# Patient Record
Sex: Female | Born: 1945 | Race: Black or African American | Hispanic: No | Smoking: Never smoker
Health system: Southern US, Community
[De-identification: ages and names within clinical notes are randomized; demographics above are authoritative.]

## PROBLEM LIST (undated history)

## (undated) DIAGNOSIS — Z972 Presence of dental prosthetic device (complete) (partial): Secondary | ICD-10-CM

## (undated) DIAGNOSIS — E119 Type 2 diabetes mellitus without complications: Secondary | ICD-10-CM

## (undated) DIAGNOSIS — E785 Hyperlipidemia, unspecified: Secondary | ICD-10-CM

## (undated) DIAGNOSIS — M1990 Unspecified osteoarthritis, unspecified site: Secondary | ICD-10-CM

## (undated) DIAGNOSIS — T753XXA Motion sickness, initial encounter: Secondary | ICD-10-CM

## (undated) DIAGNOSIS — I10 Essential (primary) hypertension: Secondary | ICD-10-CM

## (undated) HISTORY — PX: ABDOMINAL HYSTERECTOMY: SHX81

## (undated) HISTORY — PX: COLONOSCOPY: SHX174

---

## 2005-02-02 ENCOUNTER — Ambulatory Visit: Payer: Self-pay | Admitting: Internal Medicine

## 2006-06-07 ENCOUNTER — Ambulatory Visit: Payer: Self-pay | Admitting: Internal Medicine

## 2007-06-08 ENCOUNTER — Ambulatory Visit: Payer: Self-pay | Admitting: Internal Medicine

## 2007-06-15 ENCOUNTER — Emergency Department: Payer: Self-pay

## 2007-10-19 ENCOUNTER — Ambulatory Visit: Payer: Self-pay | Admitting: Gastroenterology

## 2007-11-29 ENCOUNTER — Ambulatory Visit: Payer: Self-pay | Admitting: Internal Medicine

## 2007-12-25 ENCOUNTER — Ambulatory Visit: Payer: Self-pay | Admitting: Surgery

## 2007-12-25 ENCOUNTER — Other Ambulatory Visit: Payer: Self-pay

## 2008-09-22 IMAGING — US ULTRASOUND RIGHT BREAST
1 series · 17 of 20 positions shown · non-contrast
Comparison: RIGHT mammogram and RIGHT breast ultrasound on 11/29/07.

REASON FOR EXAM: right breast mass   surg 10am    [id number]
COMMENTS:

PROCEDURE:     US  - US BREAST RIGHT  - December 27, 2007  [DATE]
RESULT:

[Series 1: ultrasound right breast · 17 of 20 slices shown]
[im 1/20]
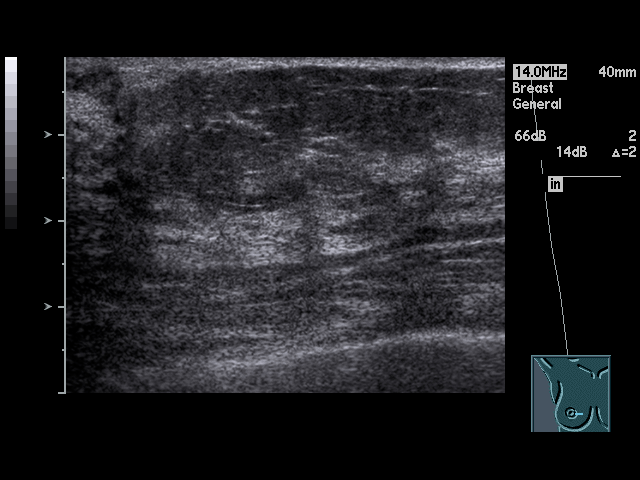
[im 2/20]
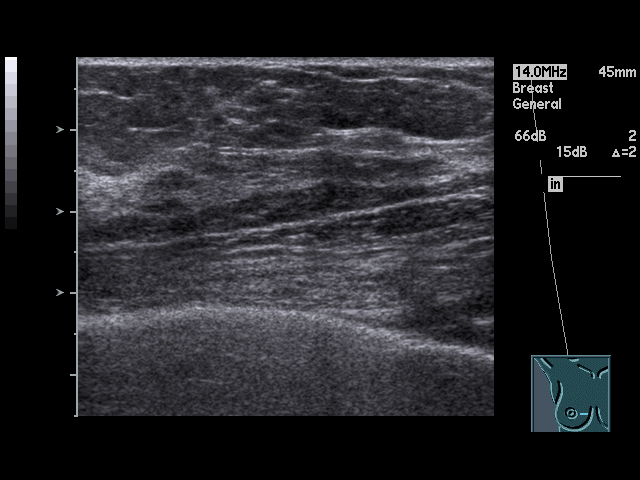
[im 3/20]
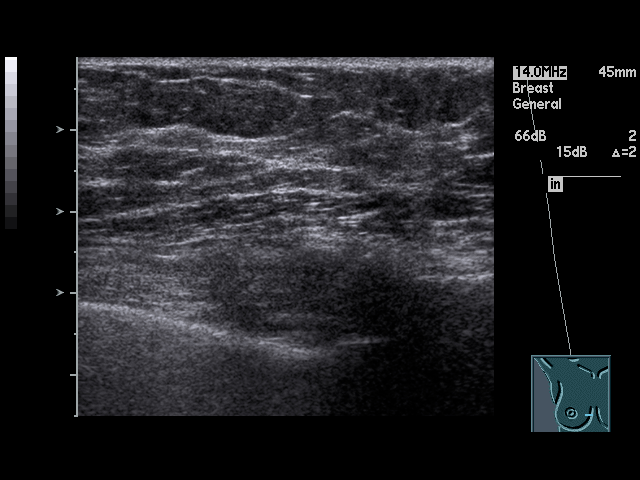
[im 5/20]
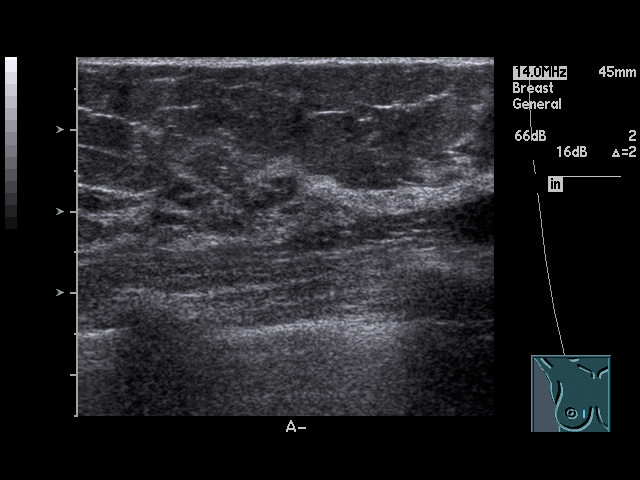
[im 6/20]
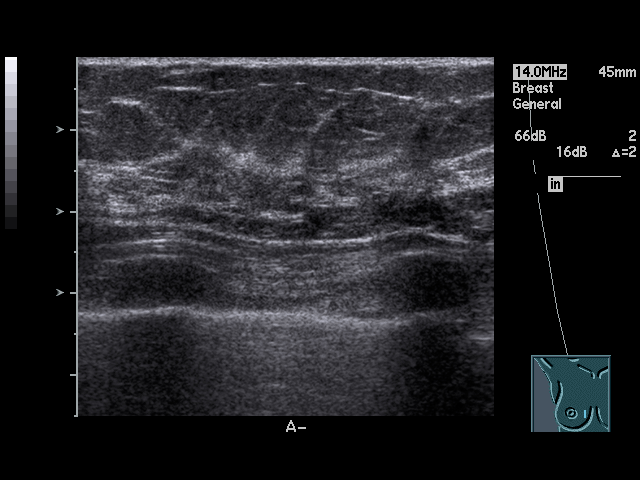
[im 7/20]
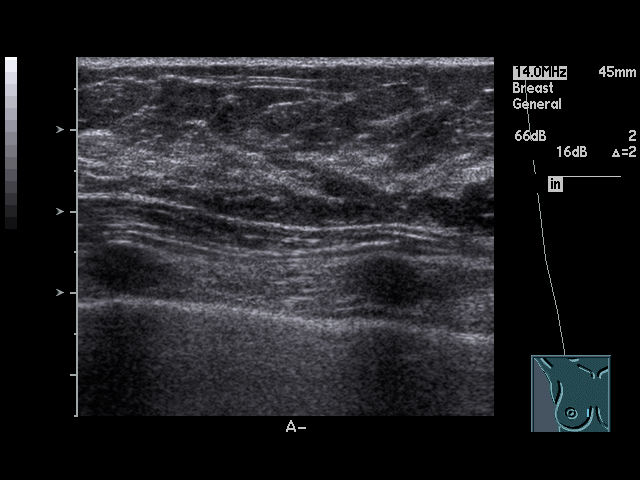
[im 8/20]
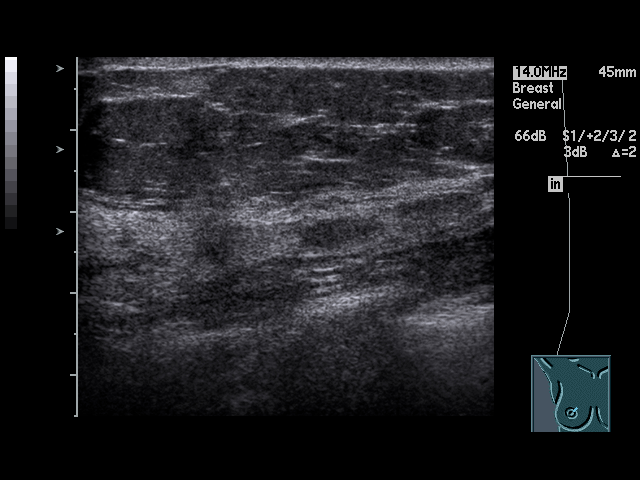
[im 9/20]
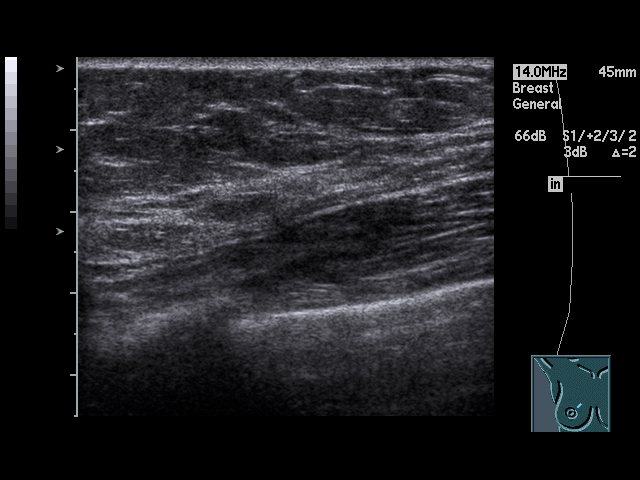
[im 11/20]
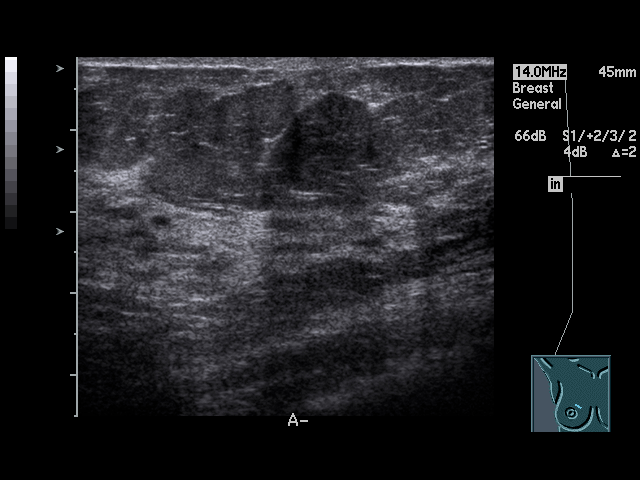
[im 12/20]
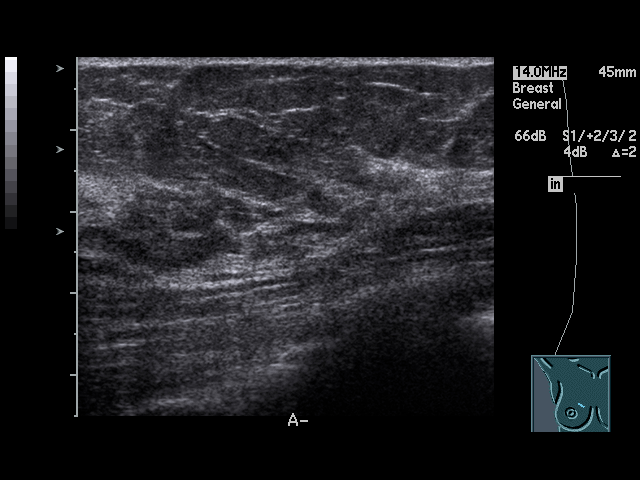
[im 13/20]
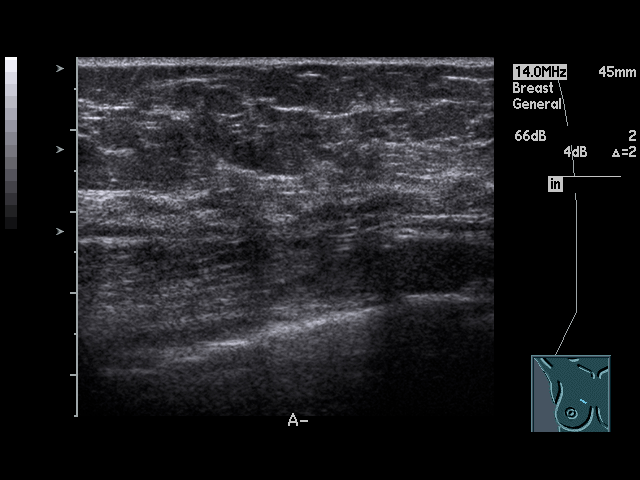
[im 14/20]
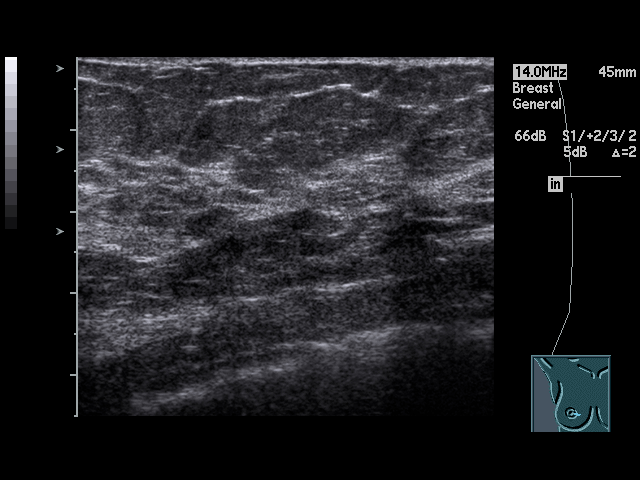
[im 15/20]
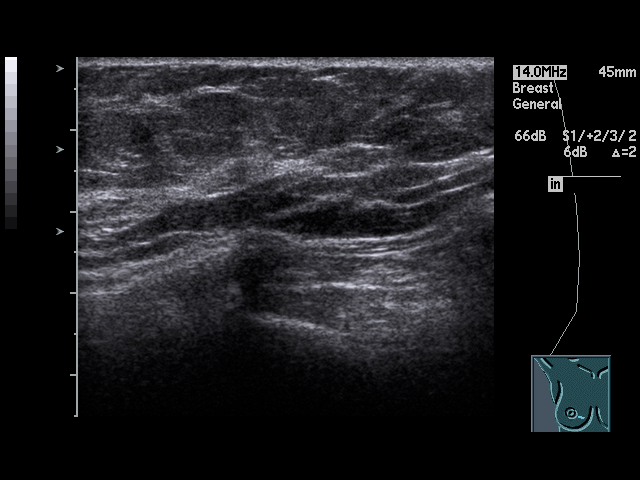
[im 16/20]
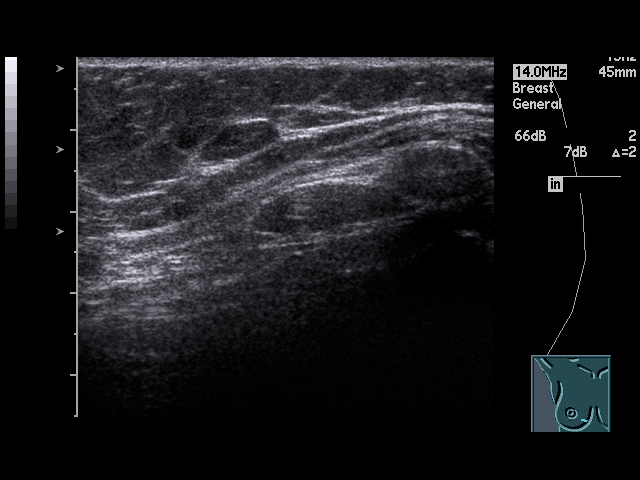
[im 18/20]
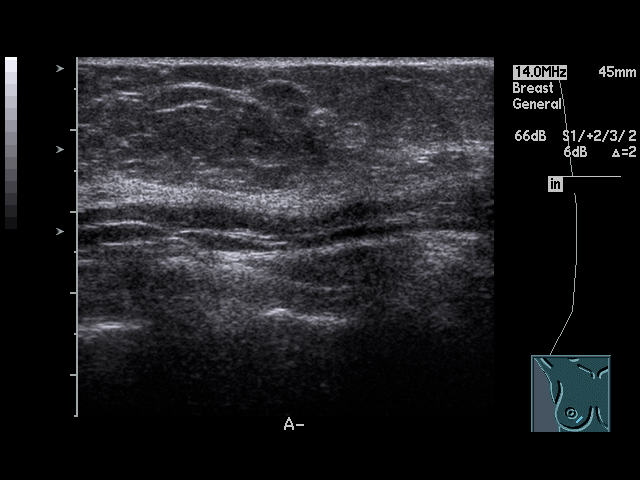
[im 19/20]
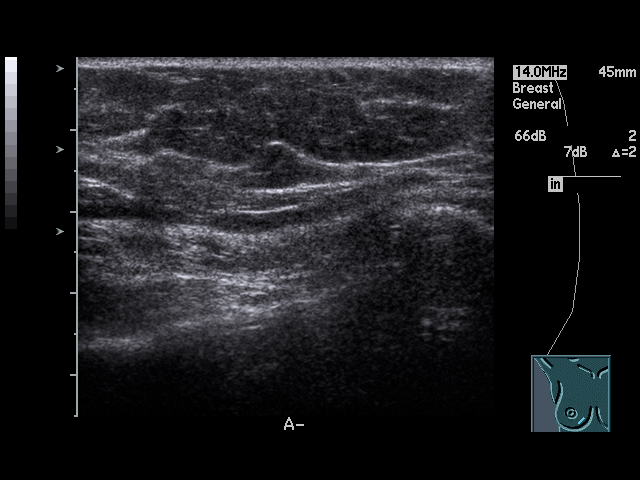
[im 20/20]
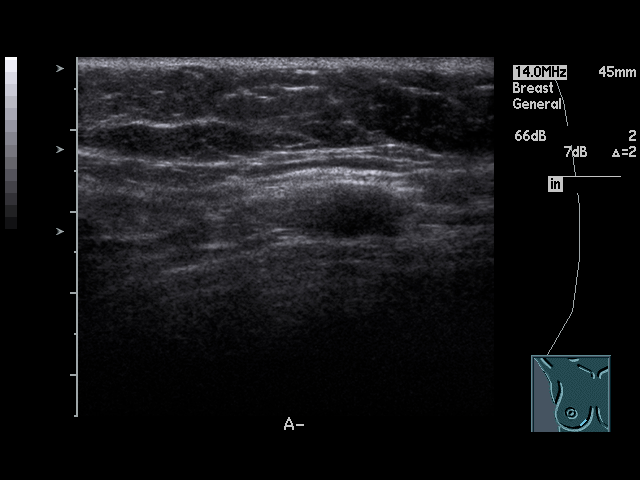

[17 of 20 positions shown; findings below may reference images not displayed]

FINDINGS: Focused RIGHT breast ultrasound was performed in anticipation
for ultrasound-guided wire localization.  The RIGHT breast was imaged from 2
to 4 o'clock.  Previously noted 4 mm hypoechoic nodule at the RIGHT breast 3
o'clock region is not seen on the current exam.  No cystic or solid mass is
seen involving the RIGHT breast from 2 to 4 o'clock.

Per patient, she no longer feels her previously noted RIGHT breast nodule.
IMPRESSION: 1.     Previously noted 4 mm nodule at the 3 o'clock region of the RIGHT
breast is not seen on the current exam.  This was discussed with the patient
and Dr. Rommel. Wire localization is cancelled at this time.
2.     Recommend clinical follow up as well as follow up by patient
self-breast exams.

## 2010-04-15 ENCOUNTER — Ambulatory Visit: Payer: Self-pay | Admitting: Obstetrics and Gynecology

## 2011-06-23 ENCOUNTER — Ambulatory Visit: Payer: Self-pay | Admitting: Internal Medicine

## 2012-08-04 ENCOUNTER — Ambulatory Visit: Payer: Self-pay | Admitting: Internal Medicine

## 2013-08-07 ENCOUNTER — Ambulatory Visit: Payer: Self-pay | Admitting: Internal Medicine

## 2014-07-19 ENCOUNTER — Ambulatory Visit: Payer: Self-pay | Admitting: Gastroenterology

## 2014-08-08 ENCOUNTER — Ambulatory Visit: Payer: Self-pay | Admitting: Gastroenterology

## 2015-01-20 LAB — SURGICAL PATHOLOGY

## 2015-03-24 DIAGNOSIS — I10 Essential (primary) hypertension: Secondary | ICD-10-CM | POA: Insufficient documentation

## 2015-03-24 DIAGNOSIS — E7800 Pure hypercholesterolemia, unspecified: Secondary | ICD-10-CM | POA: Insufficient documentation

## 2015-03-24 DIAGNOSIS — E119 Type 2 diabetes mellitus without complications: Secondary | ICD-10-CM | POA: Insufficient documentation

## 2015-03-24 DIAGNOSIS — I152 Hypertension secondary to endocrine disorders: Secondary | ICD-10-CM | POA: Insufficient documentation

## 2015-05-06 ENCOUNTER — Other Ambulatory Visit: Payer: Self-pay | Admitting: Nurse Practitioner

## 2015-05-06 DIAGNOSIS — Z13820 Encounter for screening for osteoporosis: Secondary | ICD-10-CM

## 2015-05-15 ENCOUNTER — Ambulatory Visit: Payer: Self-pay | Attending: Nurse Practitioner

## 2016-06-30 DIAGNOSIS — E1165 Type 2 diabetes mellitus with hyperglycemia: Secondary | ICD-10-CM | POA: Insufficient documentation

## 2016-06-30 DIAGNOSIS — Z794 Long term (current) use of insulin: Secondary | ICD-10-CM

## 2016-11-01 ENCOUNTER — Ambulatory Visit: Payer: Federal, State, Local not specified - PPO

## 2016-11-01 ENCOUNTER — Encounter (INDEPENDENT_AMBULATORY_CARE_PROVIDER_SITE_OTHER): Payer: Self-pay | Admitting: Podiatry

## 2016-11-01 DIAGNOSIS — M2011 Hallux valgus (acquired), right foot: Secondary | ICD-10-CM

## 2016-11-01 NOTE — Progress Notes (Signed)
This encounter was created in error - please disregard.

## 2016-11-09 ENCOUNTER — Encounter: Payer: Self-pay | Admitting: Podiatry

## 2016-11-15 ENCOUNTER — Ambulatory Visit: Payer: Self-pay | Admitting: Podiatry

## 2016-11-17 ENCOUNTER — Other Ambulatory Visit: Payer: Self-pay | Admitting: *Deleted

## 2016-11-17 ENCOUNTER — Ambulatory Visit (INDEPENDENT_AMBULATORY_CARE_PROVIDER_SITE_OTHER): Payer: Federal, State, Local not specified - PPO | Admitting: Podiatry

## 2016-11-17 ENCOUNTER — Encounter: Payer: Self-pay | Admitting: Podiatry

## 2016-11-17 ENCOUNTER — Ambulatory Visit (INDEPENDENT_AMBULATORY_CARE_PROVIDER_SITE_OTHER): Payer: Federal, State, Local not specified - PPO

## 2016-11-17 ENCOUNTER — Ambulatory Visit: Payer: Federal, State, Local not specified - PPO

## 2016-11-17 ENCOUNTER — Encounter: Payer: Self-pay | Admitting: *Deleted

## 2016-11-17 DIAGNOSIS — M216X1 Other acquired deformities of right foot: Secondary | ICD-10-CM | POA: Diagnosis not present

## 2016-11-17 DIAGNOSIS — M2041 Other hammer toe(s) (acquired), right foot: Secondary | ICD-10-CM

## 2016-11-17 DIAGNOSIS — M2011 Hallux valgus (acquired), right foot: Secondary | ICD-10-CM | POA: Diagnosis not present

## 2016-11-17 NOTE — Patient Instructions (Signed)

## 2016-11-17 NOTE — Progress Notes (Signed)
   Subjective:    Patient ID: Lisa Fletcher, female    DOB: 10/04/1945, 71 y.o.   MRN: [id number]  HPI: She presents today with a chief complaint of pain to the first metatarsophalangeal joint of the right foot. She states it is been bothering her for several years and now that she is no longer working she would like to have this fixed. She states that it becomes painful and is inhibiting her ability to perform her daily activities and stay active which is necessary to help control her diabetes. She states that her diabetes has progressively worsened and her current hemoglobin A1c is unknown. She sees Dr. Einar CrowMarshall Anderson as her general practitioner.    Review of Systems  Musculoskeletal: Positive for arthralgias.  All other systems reviewed and are negative.      Objective:   Physical Exam: I'll signs are stable she is alert and oriented 3. Pulses are strongly palpable. Neurologic sensorium is intact. Deep tendon reflexes are intact muscle strength is normal bilateral. Orthopedic evaluation of his joints all joints distal to the ankle for range of motion by crepitation. Cutaneous evaluationsupple hydrated cutis no open lesions or wounds. She has pain on palpation first metatarsophalangeal joint and second metatarsophalangeal joint with hammertoe deformities of second digit of the right foot. She has contracture deformity at the PIPJ and plantarflex her nature and it appears to be rigid and will not straighten to 180. Radiographs taken today 3 views of the bilateral foot demonstrating right foot increase in the first intermetatarsal angle and an elongated second metatarsal. Hammertoe deformity second digit is also demonstrated. Otherwise no osseous abnormalities such as fractures are identified. Cutaneous evaluationof wall hydrated cutis no erythema cellulitis drainage or odor with exception of the overlying erythema of the first metatarsophalangeal joint.        Assessment & Plan:  Diabetes  mellitus with questionable control. 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  Plan: We discussed the etiology pathology conservative versus surgical therapies. We discussed performing an Austin bunion repair second metatarsal osteotomy and hammertoe repair second digit. I answered all the questions regarding these procedures to the best of my ability in layman's terms. We did discuss the possible postop complications which may include but are not limited limited to postop pain bleeding swelling infection recurrence need for further surgery over correction under correction loss of digit loss of limb loss of life failure of the wound to heal because of the diabetes and possible amputation. She understands this is amenable to it and would like to proceed. I did discuss with her that we would have to get medical clearance from Dr. Dareen PianoAnderson she understands this and is amenable to that. We dispensed all the literature today concerning the surgical center and the [facility] that will be necessary postoperatively. I will follow-up with her at time of surgery once cleared.

## 2017-03-18 ENCOUNTER — Other Ambulatory Visit: Payer: Self-pay | Admitting: Internal Medicine

## 2017-03-18 DIAGNOSIS — Z1231 Encounter for screening mammogram for malignant neoplasm of breast: Secondary | ICD-10-CM

## 2017-04-06 ENCOUNTER — Ambulatory Visit
Admission: RE | Admit: 2017-04-06 | Discharge: 2017-04-06 | Disposition: A | Discharge status: Home / Self Care | Payer: Federal, State, Local not specified - PPO | Source: Ambulatory Visit | Attending: Internal Medicine | Admitting: Internal Medicine

## 2017-04-06 DIAGNOSIS — Z1231 Encounter for screening mammogram for malignant neoplasm of breast: Secondary | ICD-10-CM | POA: Diagnosis present

## 2017-04-18 ENCOUNTER — Encounter: Payer: Self-pay | Admitting: *Deleted

## 2017-04-18 DIAGNOSIS — G5603 Carpal tunnel syndrome, bilateral upper limbs: Secondary | ICD-10-CM | POA: Diagnosis present

## 2017-04-18 DIAGNOSIS — E785 Hyperlipidemia, unspecified: Secondary | ICD-10-CM | POA: Diagnosis not present

## 2017-04-18 DIAGNOSIS — Z794 Long term (current) use of insulin: Secondary | ICD-10-CM | POA: Diagnosis not present

## 2017-04-18 DIAGNOSIS — E1165 Type 2 diabetes mellitus with hyperglycemia: Secondary | ICD-10-CM | POA: Diagnosis not present

## 2017-04-18 DIAGNOSIS — Z79899 Other long term (current) drug therapy: Secondary | ICD-10-CM | POA: Diagnosis not present

## 2017-04-18 DIAGNOSIS — I10 Essential (primary) hypertension: Secondary | ICD-10-CM | POA: Diagnosis not present

## 2017-04-20 ENCOUNTER — Encounter
Admission: RE | Disposition: A | Discharge status: Home / Self Care | Payer: Self-pay | Source: Ambulatory Visit | Attending: Surgery

## 2017-04-20 ENCOUNTER — Ambulatory Visit
Admission: RE | Admit: 2017-04-20 | Discharge: 2017-04-20 | Disposition: A | Discharge status: Home / Self Care | Payer: Federal, State, Local not specified - PPO | Source: Ambulatory Visit | Attending: Surgery | Admitting: Surgery

## 2017-04-20 ENCOUNTER — Ambulatory Visit: Payer: Federal, State, Local not specified - PPO | Admitting: Anesthesiology

## 2017-04-20 DIAGNOSIS — E1165 Type 2 diabetes mellitus with hyperglycemia: Secondary | ICD-10-CM | POA: Insufficient documentation

## 2017-04-20 DIAGNOSIS — G5603 Carpal tunnel syndrome, bilateral upper limbs: Secondary | ICD-10-CM | POA: Diagnosis not present

## 2017-04-20 DIAGNOSIS — E785 Hyperlipidemia, unspecified: Secondary | ICD-10-CM | POA: Insufficient documentation

## 2017-04-20 DIAGNOSIS — Z79899 Other long term (current) drug therapy: Secondary | ICD-10-CM | POA: Insufficient documentation

## 2017-04-20 DIAGNOSIS — Z794 Long term (current) use of insulin: Secondary | ICD-10-CM | POA: Insufficient documentation

## 2017-04-20 DIAGNOSIS — I10 Essential (primary) hypertension: Secondary | ICD-10-CM | POA: Insufficient documentation

## 2017-04-20 HISTORY — DX: Type 2 diabetes mellitus without complications: E11.9

## 2017-04-20 HISTORY — DX: Presence of dental prosthetic device (complete) (partial): Z97.2

## 2017-04-20 HISTORY — DX: Motion sickness, initial encounter: T75.3XXA

## 2017-04-20 HISTORY — DX: Hyperlipidemia, unspecified: E78.5

## 2017-04-20 HISTORY — PX: CARPAL TUNNEL RELEASE: SHX101

## 2017-04-20 HISTORY — DX: Essential (primary) hypertension: I10

## 2017-04-20 LAB — GLUCOSE, CAPILLARY
Glucose-Capillary: 309 mg/dL — ABNORMAL HIGH (ref 65–99)
Glucose-Capillary: 284 mg/dL — ABNORMAL HIGH (ref 65–99)
Glucose-Capillary: 248 mg/dL — ABNORMAL HIGH (ref 65–99)

## 2017-04-20 SURGERY — RELEASE, CARPAL TUNNEL, ENDOSCOPIC
Anesthesia: Regional | Laterality: Left | Wound class: Clean

## 2017-04-20 MED ORDER — CLINDAMYCIN PHOSPHATE 600 MG/50ML IV SOLN
INTRAVENOUS | Status: DC | PRN
  Administered 2017-04-20: 600 mg via INTRAVENOUS

## 2017-04-20 MED ORDER — HYDROMORPHONE HCL 1 MG/ML IJ SOLN: 0.2500 mg | INTRAMUSCULAR | Status: DC | PRN

## 2017-04-20 MED ORDER — ONDANSETRON HCL 4 MG/2ML IJ SOLN
4.0000 mg | Freq: Once | INTRAMUSCULAR | Status: AC
  Administered 2017-04-20: 4 mg via INTRAVENOUS

## 2017-04-20 MED ORDER — HYDROCODONE-ACETAMINOPHEN 5-325 MG PO TABS
1.0000 | ORAL_TABLET | Freq: Four times a day (QID) | ORAL | 0 refills | Status: DC | PRN
Start: 2017-04-20 — End: 2017-06-08

## 2017-04-20 MED ORDER — INSULIN REGULAR HUMAN 100 UNIT/ML IJ SOLN
10.0000 [IU] | Freq: Once | INTRAMUSCULAR | Status: AC
  Administered 2017-04-20: 10 [IU] via SUBCUTANEOUS

## 2017-04-20 MED ORDER — ACETAMINOPHEN 325 MG PO TABS: 325.0000 mg | ORAL_TABLET | ORAL | Status: DC | PRN

## 2017-04-20 MED ORDER — CEFAZOLIN SODIUM-DEXTROSE 2-4 GM/100ML-% IV SOLN
2.0000 g | Freq: Once | INTRAVENOUS | Status: AC
  Administered 2017-04-20: 2 g via INTRAVENOUS

## 2017-04-20 MED ORDER — ACETAMINOPHEN 160 MG/5ML PO SOLN: 325.0000 mg | ORAL | Status: DC | PRN

## 2017-04-20 MED ORDER — ONDANSETRON HCL 4 MG/2ML IJ SOLN: 4.0000 mg | Freq: Once | INTRAMUSCULAR | Status: DC | PRN

## 2017-04-20 MED ORDER — PROPOFOL 500 MG/50ML IV EMUL
INTRAVENOUS | Status: DC | PRN
  Administered 2017-04-20: 120 ug/kg/min via INTRAVENOUS

## 2017-04-20 MED ORDER — ROPIVACAINE HCL 5 MG/ML IJ SOLN
INTRAMUSCULAR | Status: DC | PRN
  Administered 2017-04-20: 10 mL

## 2017-04-20 MED ORDER — MIDAZOLAM HCL 5 MG/5ML IJ SOLN
INTRAMUSCULAR | Status: DC | PRN
  Administered 2017-04-20: 2 mg via INTRAVENOUS

## 2017-04-20 MED ORDER — LACTATED RINGERS IV SOLN
INTRAVENOUS | Status: DC
  Administered 2017-04-20: 11:00:00 via INTRAVENOUS

## 2017-04-20 MED ORDER — DIPHENHYDRAMINE HCL 50 MG/ML IJ SOLN
12.5000 mg | Freq: Once | INTRAMUSCULAR | Status: AC
  Administered 2017-04-20: 12.5 mg via INTRAVENOUS

## 2017-04-20 MED ORDER — LIDOCAINE HCL (PF) 0.5 % IJ SOLN
INTRAMUSCULAR | Status: DC | PRN
  Administered 2017-04-20: 35 mL via INTRAVENOUS

## 2017-04-20 MED ORDER — OXYCODONE HCL 5 MG PO TABS: 5.0000 mg | ORAL_TABLET | Freq: Once | ORAL | Status: DC | PRN

## 2017-04-20 MED ORDER — PROPOFOL 10 MG/ML IV BOLUS
INTRAVENOUS | Status: DC | PRN
  Administered 2017-04-20: 20 mg via INTRAVENOUS
  Administered 2017-04-20: 30 mg via INTRAVENOUS

## 2017-04-20 MED ORDER — OXYCODONE HCL 5 MG/5ML PO SOLN: 5.0000 mg | Freq: Once | ORAL | Status: DC | PRN

## 2017-04-20 SURGICAL SUPPLY — 27 items
BANDAGE ELASTIC 2 LF NS (GAUZE/BANDAGES/DRESSINGS) ×3 IMPLANT
BNDG COHESIVE 4X5 TAN STRL (GAUZE/BANDAGES/DRESSINGS) ×3 IMPLANT
BNDG ESMARK 4X12 TAN STRL LF (GAUZE/BANDAGES/DRESSINGS) ×3 IMPLANT
CHLORAPREP W/TINT 26ML (MISCELLANEOUS) ×3 IMPLANT
CORD BIP STRL DISP 12FT (MISCELLANEOUS) ×3 IMPLANT
COVER LIGHT HANDLE UNIVERSAL (MISCELLANEOUS) ×6 IMPLANT
CUFF TOURNIQUET DUAL PORT 18X3 (MISCELLANEOUS) ×3 IMPLANT
DRAPE SURG 17X11 SM STRL (DRAPES) ×3 IMPLANT
GAUZE PETRO XEROFOAM 1X8 (MISCELLANEOUS) ×3 IMPLANT
GAUZE SPONGE 4X4 12PLY STRL (GAUZE/BANDAGES/DRESSINGS) ×3 IMPLANT
GLOVE BIO SURGEON STRL SZ8 (GLOVE) ×3 IMPLANT
GLOVE INDICATOR 8.0 STRL GRN (GLOVE) ×3 IMPLANT
GOWN STRL REUS W/ TWL LRG LVL3 (GOWN DISPOSABLE) ×1 IMPLANT
GOWN STRL REUS W/ TWL XL LVL3 (GOWN DISPOSABLE) ×1 IMPLANT
GOWN STRL REUS W/TWL LRG LVL3 (GOWN DISPOSABLE) ×2
GOWN STRL REUS W/TWL XL LVL3 (GOWN DISPOSABLE) ×2
KIT CARPAL TUNNEL (MISCELLANEOUS) ×2
KIT ESCP INSRT D SLOT CANN KN (MISCELLANEOUS) ×1 IMPLANT
KIT ROOM TURNOVER OR (KITS) ×3 IMPLANT
NS IRRIG 500ML POUR BTL (IV SOLUTION) ×3 IMPLANT
PACK EXTREMITY ARMC (MISCELLANEOUS) ×3 IMPLANT
SPLINT WRIST/FOREARM M LT TX99 (SOFTGOODS) ×3 IMPLANT
STOCKINETTE IMPERVIOUS 9X36 MD (GAUZE/BANDAGES/DRESSINGS) ×3 IMPLANT
STRAP BODY AND KNEE 60X3 (MISCELLANEOUS) ×3 IMPLANT
SUT PROLENE 4 0 PS 2 18 (SUTURE) ×3 IMPLANT
SUT VIC AB 3-0 SH 27 (SUTURE)
SUT VIC AB 3-0 SH 27X BRD (SUTURE) IMPLANT

## 2017-04-20 NOTE — Transfer of Care (Signed)
Immediate Anesthesia Transfer of Care Note  Patient: Lisa Fletcher  Procedure(s) Performed: Procedure(s) with comments: CARPAL TUNNEL RELEASE ENDOSCOPIC (Left) - Diabetic - insulin  Patient Location: PACU  Anesthesia Type: Bier Block  Level of Consciousness: awake, alert  and patient cooperative  Airway and Oxygen Therapy: Patient Spontanous Breathing and Patient connected to supplemental oxygen  Post-op Assessment: Post-op Vital signs reviewed, Patient's Cardiovascular Status Stable, Respiratory Function Stable, Patent Airway and No signs of Nausea or vomiting  Post-op Vital Signs: Reviewed and stable  Complications: No apparent anesthesia complications

## 2017-04-20 NOTE — Anesthesia Postprocedure Evaluation (Signed)
Anesthesia Post Note  Patient: Urbano HeirStella L Coryell  Procedure(s) Performed: Procedure(s) (LRB): CARPAL TUNNEL RELEASE ENDOSCOPIC (Left)  Patient location during evaluation: PACU Anesthesia Type: Bier Block Level of consciousness: awake and alert Pain management: pain level controlled Vital Signs Assessment: post-procedure vital signs reviewed and stable Respiratory status: spontaneous breathing, nonlabored ventilation, respiratory function stable and patient connected to nasal cannula oxygen Cardiovascular status: blood pressure returned to baseline and stable Postop Assessment: no signs of nausea or vomiting Anesthetic complications: no    Alta CorningBacon, Joselinne Lawal S

## 2017-04-20 NOTE — Op Note (Signed)
04/20/2017  12:51 PM  Patient:   Lisa Fletcher  Pre-Op Diagnosis:   Left carpal tunnel syndrome.  Post-Op Diagnosis:   Same.  Procedure:   Endoscopic left carpal tunnel release.  Surgeon:   Maryagnes AmosJ. Jeffrey Genita Nilsson, MD  Anesthesia:   Bier block  Findings:   As above.  Complications:   None  EBL:   0 cc  Fluids:   300 cc crystalloid  TT:   33 minutes at 250 mmHg  Drains:   None  Closure:   4-0 Prolene interrupted sutures  Brief Clinical Note:   The patient is a 71 year old female with a history of progressively worsening pain and paresthesias to her left hand. Her history and examination are consistent with carpal tunnel syndrome. The symptoms have persisted despite medications, activity modification, etc. The patient presents at this time for an endoscopic left carpal tunnel release.   Procedure:   The patient was brought into the operating room and lain in the supine position. After adequate IV sedation was achieved, a timeout was performed to verify the appropriate surgical site before a Bier block was placed by the anesthesiologist and the tourniquet inflated to 250 mmHg. The left hand and upper extremity were prepped with ChloraPrep solution before being draped sterilely. Preoperative antibiotics were administered. An approximately 1.5-2 cm incision was made over the volar wrist flexion crease, centered over the palmaris longus tendon. The incision was carried down through the subcutaneous tissues with care taken to identify and protect any neurovascular structures. The distal forearm fascia was penetrated just proximal to the transverse carpal ligament. The soft tissues were released off the superficial and deep surfaces of the distal forearm fascia and this was released proximally for 3-4 cm under direct visualization.  Attention was directed distally. The Therapist, nutritionalreer elevator was passed beneath the transverse carpal ligament along the ulnar aspect of the carpal tunnel and used to release  any adhesions as well as to remove any adherent synovial tissue before first the smaller then the larger of the two dilators were passed beneath the transverse carpal ligament along the ulnar margin of the carpal tunnel. The slotted cannula was introduced and the endoscope was placed into the slotted cannula and the undersurface of the transverse carpal ligament visualized. The distal margin of the transverse carpal ligament was marked by placing a 25-gauge needle percutaneously at Kaplan's cardinal point so that it entered the distal portion of the slotted cannula. Under endoscopic visualization, the transverse carpal ligament was released from proximal to distal using the end-cutting blade. A second pass was performed to ensure complete release of the ligament. The adequacy of release was verified both endoscopically and by palpation using the freer elevator.  The wound was irrigated thoroughly with sterile saline solution before being closed using 4-0 Prolene interrupted sutures. A total of 10 cc of 0.5% plain Sensorcaine was injected in and around the incision before a sterile bulky dressing was applied to the wound. The patient was placed into a volar wrist splint before being awakened and returned to the recovery room in satisfactory condition after tolerating the procedure well.

## 2017-04-20 NOTE — H&P (Signed)
Paper H&P to be scanned into permanent record. H&P reviewed and patient re-examined. No changes. 

## 2017-04-20 NOTE — Progress Notes (Signed)
Patient experienced a sudden onset of nausea and vomiting. Verbal order received to discontinue ancef and administer zofran and benadryl IV. Noted improvement in nausea and vomiting prior to transfer to OR.

## 2017-04-20 NOTE — Anesthesia Preprocedure Evaluation (Signed)
Anesthesia Evaluation  Patient identified by MRN, date of birth, ID band Patient awake    Reviewed: Allergy & Precautions, H&P , NPO status , Patient's Chart, lab work & pertinent test results, reviewed documented beta blocker date and time   Airway Mallampati: II  TM Distance: >3 FB Neck ROM: full    Dental  (+) Partial Upper   Pulmonary neg pulmonary ROS,    Pulmonary exam normal breath sounds clear to auscultation       Cardiovascular Exercise Tolerance: Good hypertension, On Medications Normal cardiovascular exam Rhythm:regular Rate:Normal     Neuro/Psych negative neurological ROS  negative psych ROS   GI/Hepatic negative GI ROS, Neg liver ROS,   Endo/Other  diabetes, Poorly Controlled, Type 2, Insulin Dependent  Renal/GU negative Renal ROS  negative genitourinary   Musculoskeletal   Abdominal   Peds  Hematology negative hematology ROS (+)   Anesthesia Other Findings   Reproductive/Obstetrics negative OB ROS                             Anesthesia Physical  Anesthesia Plan  ASA: II  Anesthesia Plan: Bier Block   Post-op Pain Management:    Induction:   PONV Risk Score and Plan:   Airway Management Planned:   Additional Equipment:   Intra-op Plan:   Post-operative Plan:   Informed Consent: I have reviewed the patients History and Physical, chart, labs and discussed the procedure including the risks, benefits and alternatives for the proposed anesthesia with the patient or authorized representative who has indicated his/her understanding and acceptance.   Dental Advisory Given  Plan Discussed with: CRNA  Anesthesia Plan Comments:         Anesthesia Quick Evaluation  

## 2017-04-20 NOTE — Discharge Instructions (Signed)
General Anesthesia, Adult, Care After °These instructions provide you with information about caring for yourself after your procedure. Your health care provider may also give you more specific instructions. Your treatment has been planned according to current medical practices, but problems sometimes occur. Call your health care provider if you have any problems or questions after your procedure. °What can I expect after the procedure? °After the procedure, it is common to have: °· Vomiting. °· A sore throat. °· Mental slowness. ° °It is common to feel: °· Nauseous. °· Cold or shivery. °· Sleepy. °· Tired. °· Sore or achy, even in parts of your body where you did not have surgery. ° °Follow these instructions at home: °For at least 24 hours after the procedure: °· Do not: °? Participate in activities where you could fall or become injured. °? Drive. °? Use heavy machinery. °? Drink alcohol. °? Take sleeping pills or medicines that cause drowsiness. °? Make important decisions or sign legal documents. °? Take care of children on your own. °· Rest. °Eating and drinking °· If you vomit, drink water, juice, or soup when you can drink without vomiting. °· Drink enough fluid to keep your urine clear or pale yellow. °· Make sure you have little or no nausea before eating solid foods. °· Follow the diet recommended by your health care provider. °General instructions °· Have a responsible adult stay with you until you are awake and alert. °· Return to your normal activities as told by your health care provider. Ask your health care provider what activities are safe for you. °· Take over-the-counter and prescription medicines only as told by your health care provider. °· If you smoke, do not smoke without supervision. °· Keep all follow-up visits as told by your health care provider. This is important. °Contact a health care provider if: °· You continue to have nausea or vomiting at home, and medicines are not helpful. °· You  cannot drink fluids or start eating again. °· You cannot urinate after 8-12 hours. °· You develop a skin rash. °· You have fever. °· You have increasing redness at the site of your procedure. °Get help right away if: °· You have difficulty breathing. °· You have chest pain. °· You have unexpected bleeding. °· You feel that you are having a life-threatening or urgent problem. °This information is not intended to replace advice given to you by your health care provider. Make sure you discuss any questions you have with your health care provider. °Document Released: 12/20/2000 Document Revised: 02/16/2016 Document Reviewed: 08/28/2015 °Elsevier Interactive Patient Education © 2018 [employer] ° ° °Keep dressing dry and intact. °Keep hand elevated above heart level. °May shower after dressing removed on postop day 4 (Sunday). °Cover sutures with Band-Aids after drying off. °Apply ice to affected area frequently. °Take ibuprofen 600 mg TID with meals for 7-10 days, then as necessary. °Take ES Tylenol or pain medication as prescribed when needed.  °Return for follow-up in 10-14 days or as scheduled. °

## 2017-04-20 NOTE — Anesthesia Procedure Notes (Signed)
Procedure Name: MAC Date/Time: 04/20/2017 12:07 PM Performed by: Janna Arch Pre-anesthesia Checklist: Patient identified, Emergency Drugs available, Suction available and Patient being monitored Patient Re-evaluated:Patient Re-evaluated prior to induction Oxygen Delivery Method: Simple face mask

## 2017-04-21 ENCOUNTER — Encounter: Payer: Self-pay | Admitting: Surgery

## 2017-06-08 ENCOUNTER — Encounter
Admission: RE | Disposition: A | Discharge status: Home / Self Care | Payer: Self-pay | Source: Ambulatory Visit | Attending: Surgery

## 2017-06-08 ENCOUNTER — Ambulatory Visit: Payer: Federal, State, Local not specified - PPO | Admitting: Anesthesiology

## 2017-06-08 ENCOUNTER — Ambulatory Visit
Admission: RE | Admit: 2017-06-08 | Discharge: 2017-06-08 | Disposition: A | Discharge status: Home / Self Care | Payer: Federal, State, Local not specified - PPO | Source: Ambulatory Visit | Attending: Surgery | Admitting: Surgery

## 2017-06-08 DIAGNOSIS — Z86010 Personal history of colonic polyps: Secondary | ICD-10-CM | POA: Diagnosis not present

## 2017-06-08 DIAGNOSIS — E119 Type 2 diabetes mellitus without complications: Secondary | ICD-10-CM | POA: Diagnosis not present

## 2017-06-08 DIAGNOSIS — G5601 Carpal tunnel syndrome, right upper limb: Secondary | ICD-10-CM | POA: Insufficient documentation

## 2017-06-08 DIAGNOSIS — Z79899 Other long term (current) drug therapy: Secondary | ICD-10-CM | POA: Diagnosis not present

## 2017-06-08 DIAGNOSIS — Z803 Family history of malignant neoplasm of breast: Secondary | ICD-10-CM | POA: Diagnosis not present

## 2017-06-08 DIAGNOSIS — Z794 Long term (current) use of insulin: Secondary | ICD-10-CM | POA: Insufficient documentation

## 2017-06-08 DIAGNOSIS — I10 Essential (primary) hypertension: Secondary | ICD-10-CM | POA: Insufficient documentation

## 2017-06-08 DIAGNOSIS — Z881 Allergy status to other antibiotic agents status: Secondary | ICD-10-CM | POA: Insufficient documentation

## 2017-06-08 DIAGNOSIS — E785 Hyperlipidemia, unspecified: Secondary | ICD-10-CM | POA: Insufficient documentation

## 2017-06-08 HISTORY — PX: CARPAL TUNNEL RELEASE: SHX101

## 2017-06-08 LAB — GLUCOSE, CAPILLARY
GLUCOSE-CAPILLARY: 81 mg/dL (ref 65–99)
Glucose-Capillary: 116 mg/dL — ABNORMAL HIGH (ref 65–99)

## 2017-06-08 SURGERY — RELEASE, CARPAL TUNNEL, ENDOSCOPIC
Anesthesia: Regional | Site: Hand | Laterality: Right | Wound class: Clean

## 2017-06-08 MED ORDER — PROMETHAZINE HCL 25 MG/ML IJ SOLN: 6.2500 mg | INTRAMUSCULAR | Status: DC | PRN

## 2017-06-08 MED ORDER — ACETAMINOPHEN 325 MG PO TABS: 325.0000 mg | ORAL_TABLET | ORAL | Status: DC | PRN

## 2017-06-08 MED ORDER — HYDROCODONE-ACETAMINOPHEN 5-325 MG PO TABS: 1.0000 | ORAL_TABLET | Freq: Four times a day (QID) | ORAL | 0 refills | Status: DC | PRN

## 2017-06-08 MED ORDER — MIDAZOLAM HCL 5 MG/5ML IJ SOLN
INTRAMUSCULAR | Status: DC | PRN
  Administered 2017-06-08: 2 mg via INTRAVENOUS

## 2017-06-08 MED ORDER — PROPOFOL 500 MG/50ML IV EMUL
INTRAVENOUS | Status: DC | PRN
  Administered 2017-06-08: 25 ug/kg/min via INTRAVENOUS

## 2017-06-08 MED ORDER — DEXTROSE 5 % IV SOLN
900.0000 mg | Freq: Once | INTRAVENOUS | Status: AC
  Administered 2017-06-08: 900 mg via INTRAVENOUS

## 2017-06-08 MED ORDER — LACTATED RINGERS IV SOLN
10.0000 mL/h | INTRAVENOUS | Status: DC
  Administered 2017-06-08: 10 mL/h via INTRAVENOUS

## 2017-06-08 MED ORDER — ONDANSETRON HCL 4 MG/2ML IJ SOLN
INTRAMUSCULAR | Status: DC | PRN
  Administered 2017-06-08: 4 mg via INTRAVENOUS

## 2017-06-08 MED ORDER — ONDANSETRON 4 MG PO TBDP: 4.0000 mg | ORAL_TABLET | Freq: Three times a day (TID) | ORAL | 0 refills | Status: DC | PRN

## 2017-06-08 MED ORDER — ACETAMINOPHEN 160 MG/5ML PO SOLN: 325.0000 mg | ORAL | Status: DC | PRN

## 2017-06-08 MED ORDER — OXYCODONE HCL 5 MG PO TABS: 5.0000 mg | ORAL_TABLET | Freq: Once | ORAL | Status: DC | PRN

## 2017-06-08 MED ORDER — OXYCODONE HCL 5 MG/5ML PO SOLN: 5.0000 mg | Freq: Once | ORAL | Status: DC | PRN

## 2017-06-08 MED ORDER — BUPIVACAINE HCL (PF) 0.5 % IJ SOLN
INTRAMUSCULAR | Status: DC | PRN
  Administered 2017-06-08: 10 mL

## 2017-06-08 MED ORDER — FENTANYL CITRATE (PF) 100 MCG/2ML IJ SOLN
25.0000 ug | INTRAMUSCULAR | Status: DC | PRN
  Administered 2017-06-08: 50 ug via INTRAVENOUS

## 2017-06-08 MED ORDER — LIDOCAINE HCL (CARDIAC) 20 MG/ML IV SOLN
INTRAVENOUS | Status: DC | PRN
  Administered 2017-06-08: 40 mg via INTRAVENOUS

## 2017-06-08 SURGICAL SUPPLY — 27 items
BANDAGE ELASTIC 2 LF NS (GAUZE/BANDAGES/DRESSINGS) ×3 IMPLANT
BNDG COHESIVE 4X5 TAN STRL (GAUZE/BANDAGES/DRESSINGS) ×3 IMPLANT
BNDG ESMARK 4X12 TAN STRL LF (GAUZE/BANDAGES/DRESSINGS) ×3 IMPLANT
CHLORAPREP W/TINT 26ML (MISCELLANEOUS) ×3 IMPLANT
CORD BIP STRL DISP 12FT (MISCELLANEOUS) ×3 IMPLANT
COVER LIGHT HANDLE UNIVERSAL (MISCELLANEOUS) ×6 IMPLANT
CUFF TOURNIQUET DUAL PORT 18X3 (MISCELLANEOUS) ×3 IMPLANT
DRAPE SURG 17X11 SM STRL (DRAPES) ×3 IMPLANT
GAUZE PETRO XEROFOAM 1X8 (MISCELLANEOUS) ×3 IMPLANT
GAUZE SPONGE 4X4 12PLY STRL (GAUZE/BANDAGES/DRESSINGS) ×3 IMPLANT
GLOVE BIO SURGEON STRL SZ8 (GLOVE) ×3 IMPLANT
GLOVE INDICATOR 8.0 STRL GRN (GLOVE) ×3 IMPLANT
GOWN STRL REUS W/ TWL LRG LVL3 (GOWN DISPOSABLE) ×1 IMPLANT
GOWN STRL REUS W/ TWL XL LVL3 (GOWN DISPOSABLE) ×1 IMPLANT
GOWN STRL REUS W/TWL LRG LVL3 (GOWN DISPOSABLE) ×2
GOWN STRL REUS W/TWL XL LVL3 (GOWN DISPOSABLE) ×2
KIT CARPAL TUNNEL (MISCELLANEOUS) ×2
KIT ESCP INSRT D SLOT CANN KN (MISCELLANEOUS) ×1 IMPLANT
KIT ROOM TURNOVER OR (KITS) ×3 IMPLANT
NS IRRIG 500ML POUR BTL (IV SOLUTION) ×3 IMPLANT
PACK EXTREMITY ARMC (MISCELLANEOUS) ×3 IMPLANT
SPLINT WRIST M RT TX990303 (SOFTGOODS) ×3 IMPLANT
STOCKINETTE IMPERVIOUS 9X36 MD (GAUZE/BANDAGES/DRESSINGS) ×3 IMPLANT
STRAP BODY AND KNEE 60X3 (MISCELLANEOUS) ×3 IMPLANT
SUT PROLENE 4 0 PS 2 18 (SUTURE) ×3 IMPLANT
SUT VIC AB 3-0 SH 27 (SUTURE)
SUT VIC AB 3-0 SH 27X BRD (SUTURE) IMPLANT

## 2017-06-08 NOTE — Anesthesia Postprocedure Evaluation (Signed)
Anesthesia Post Note  Patient: Lisa Fletcher  Procedure(s) Performed: Procedure(s) (LRB): CARPAL TUNNEL RELEASE ENDOSCOPIC (Right)  Patient location during evaluation: PACU Anesthesia Type: Bier Block Level of consciousness: awake Pain management: pain level controlled Vital Signs Assessment: post-procedure vital signs reviewed and stable Respiratory status: spontaneous breathing, nonlabored ventilation and respiratory function stable Cardiovascular status: stable Postop Assessment: no signs of nausea or vomiting Anesthetic complications: no    Jola BabinskiElsje Kashis Penley

## 2017-06-08 NOTE — H&P (Signed)
Paper H&P to be scanned into permanent record. H&P reviewed and patient re-examined. No changes. 

## 2017-06-08 NOTE — Transfer of Care (Signed)
Immediate Anesthesia Transfer of Care Note  Patient: Lisa Fletcher  Procedure(s) Performed: Procedure(s): CARPAL TUNNEL RELEASE ENDOSCOPIC (Right)  Patient Location: PACU  Anesthesia Type: Bier Block  Level of Consciousness: awake, alert  and patient cooperative  Airway and Oxygen Therapy: Patient Spontanous Breathing and Patient connected to supplemental oxygen  Post-op Assessment: Post-op Vital signs reviewed, Patient's Cardiovascular Status Stable, Respiratory Function Stable, Patent Airway and No signs of Nausea or vomiting  Post-op Vital Signs: Reviewed and stable  Complications: No apparent anesthesia complications

## 2017-06-08 NOTE — Anesthesia Procedure Notes (Signed)
Anesthesia Regional Block: Bier block (IV Regional)   Pre-Anesthetic Checklist: ,, timeout performed, Correct Patient, Correct Site, Correct Laterality, Correct Procedure, Correct Position, site marked, Risks and benefits discussed,  Surgical consent,  Pre-op evaluation,  At surgeon's request and post-op pain management  Laterality: Right      Procedures:,,,,,,, Esmarch exsanguination, #20gu IV placed and double tourniquet utilized  Narrative:  Start time: 06/08/2017 11:31 AM End time: 06/08/2017 11:32 AM  Performed by: With CRNAs  Anesthesiologist: Jola BabinskiHARKER, ELSJE CRNA: Jimmy PicketAMYOT, Friedrich Harriott  Additional Notes: Double tourniquet applied to R arm. Esmarch exsanguination of arm. Distal and proximal tourniquet inflated, distal deflated. R radial pulse check negative. Injected 35mL of 0.5% plain lidocaine via 20g angio to R hand. 20g angio d/c'd after injection. Tolerated well by pt.

## 2017-06-08 NOTE — Discharge Instructions (Signed)
General Anesthesia, Adult, Care After These instructions provide you with information about caring for yourself after your procedure. Your health care provider may also give you more specific instructions. Your treatment has been planned according to current medical practices, but problems sometimes occur. Call your health care provider if you have any problems or questions after your procedure. What can I expect after the procedure? After the procedure, it is common to have:  Vomiting.  A sore throat.  Mental slowness.  It is common to feel:  Nauseous.  Cold or shivery.  Sleepy.  Tired.  Sore or achy, even in parts of your body where you did not have surgery.  Follow these instructions at home: For at least 24 hours after the procedure:  Do not: ? Participate in activities where you could fall or become injured. ? Drive. ? Use heavy machinery. ? Drink alcohol. ? Take sleeping pills or medicines that cause drowsiness. ? Make important decisions or sign legal documents. ? Take care of children on your own.  Rest. Eating and drinking  If you vomit, drink water, juice, or soup when you can drink without vomiting.  Drink enough fluid to keep your urine clear or pale yellow.  Make sure you have little or no nausea before eating solid foods.  Follow the diet recommended by your health care provider. General instructions  Have a responsible adult stay with you until you are awake and alert.  Return to your normal activities as told by your health care provider. Ask your health care provider what activities are safe for you.  Take over-the-counter and prescription medicines only as told by your health care provider.  If you smoke, do not smoke without supervision.  Keep all follow-up visits as told by your health care provider. This is important. Contact a health care provider if:  You continue to have nausea or vomiting at home, and medicines are not helpful.  You  cannot drink fluids or start eating again.  You cannot urinate after 8-12 hours.  You develop a skin rash.  You have fever.  You have increasing redness at the site of your procedure. Get help right away if:  You have difficulty breathing.  You have chest pain.  You have unexpected bleeding.  You feel that you are having a life-threatening or urgent problem. This information is not intended to replace advice given to you by your health care provider. Make sure you discuss any questions you have with your health care provider. Document Released: 12/20/2000 Document Revised: 02/16/2016 Document Reviewed: 08/28/2015 Elsevier Interactive Patient Education  2018 [employer]  Keep dressing dry and intact. Keep hand elevated above heart level. May shower after dressing removed on postop day 4 (Sunday). Cover sutures with Band-Aids after drying off. Apply ice to affected area frequently. Take ibuprofen 600 mg TID with meals for 7-10 days, then as necessary. Take pain medication as prescribed or ES Tylenol when needed.  Return for follow-up in 10-14 days or as scheduled.

## 2017-06-08 NOTE — Anesthesia Preprocedure Evaluation (Signed)
Anesthesia Evaluation  Patient identified by MRN, date of birth, ID band Patient awake    Reviewed: Allergy & Precautions, H&P , NPO status , Patient's Chart, lab work & pertinent test results, reviewed documented beta blocker date and time   Airway Mallampati: II  TM Distance: >3 FB Neck ROM: full    Dental  (+) Partial Upper   Pulmonary neg pulmonary ROS,    Pulmonary exam normal breath sounds clear to auscultation       Cardiovascular Exercise Tolerance: Good hypertension, On Medications Normal cardiovascular exam Rhythm:regular Rate:Normal     Neuro/Psych negative neurological ROS  negative psych ROS   GI/Hepatic negative GI ROS, Neg liver ROS,   Endo/Other  diabetes, Poorly Controlled, Type 2, Insulin Dependent  Renal/GU negative Renal ROS  negative genitourinary   Musculoskeletal   Abdominal   Peds  Hematology negative hematology ROS (+)   Anesthesia Other Findings   Reproductive/Obstetrics negative OB ROS                             Anesthesia Physical  Anesthesia Plan  ASA: II  Anesthesia Plan: Bier Block   Post-op Pain Management:    Induction:   PONV Risk Score and Plan:   Airway Management Planned:   Additional Equipment:   Intra-op Plan:   Post-operative Plan:   Informed Consent: I have reviewed the patients History and Physical, chart, labs and discussed the procedure including the risks, benefits and alternatives for the proposed anesthesia with the patient or authorized representative who has indicated his/her understanding and acceptance.   Dental Advisory Given  Plan Discussed with: CRNA  Anesthesia Plan Comments:         Anesthesia Quick Evaluation

## 2017-06-08 NOTE — Op Note (Signed)
06/08/2017  12:12 PM  Patient:   Lisa HeirStella L Fletcher  Pre-Op Diagnosis:   Right carpal tunnel syndrome.  Post-Op Diagnosis:   Same.  Procedure:   Endoscopic right carpal tunnel release.  Surgeon:   Maryagnes AmosJ. Jeffrey Dalvin Clipper, MD  Anesthesia:   Bier block  Findings:   As above.  Complications:   None  EBL:   1 cc  Fluids:   550 cc crystalloid  TT:   26 minutes at 250 mmHg  Drains:   None  Closure:   4-0 Prolene interrupted sutures  Brief Clinical Note:   The patient is a 71 year old female with a history of progressively worsening right hand pain and paresthesias. Her symptoms have progressed despite medications, activity modification, etc. Her history and examination are consistent with carpal tunnel syndrome, confirmed by EMG. The patient presents at this time for an endoscopic right carpal tunnel release.   Procedure:   The patient was brought into the operating room and lain in the supine position. After adequate IV sedation was achieved, a timeout was performed to verify the appropriate surgical site before a Bier block was placed by the anesthesiologist and the tourniquet inflated to 250 mmHg. The right hand and upper extremity were prepped with ChloraPrep solution before being draped sterilely. Preoperative antibiotics were administered. An approximately 1.5-2 cm incision was made over the volar wrist flexion crease, centered over the palmaris longus tendon. The incision was carried down through the subcutaneous tissues with care taken to identify and protect any neurovascular structures. The distal forearm fascia was penetrated just proximal to the transverse carpal ligament. The soft tissues were released off the superficial and deep surfaces of the distal forearm fascia and this was released proximally for 3-4 cm under direct visualization.  Attention was directed distally. The Therapist, nutritionalreer elevator was passed beneath the transverse carpal ligament along the ulnar aspect of the carpal tunnel and  used to release any adhesions as well as to remove any adherent synovial tissue before first the smaller then the larger of the two dilators were passed beneath the transverse carpal ligament along the ulnar margin of the carpal tunnel. The slotted cannula was introduced and the endoscope was placed into the slotted cannula and the undersurface of the transverse carpal ligament visualized. The distal margin of the transverse carpal ligament was marked by placing a 25-gauge needle percutaneously at Kaplan's cardinal point so that it entered the distal portion of the slotted cannula. Under endoscopic visualization, the transverse carpal ligament was released from proximal to distal using the end-cutting blade. A second pass was performed to ensure complete release of the ligament. The adequacy of release was verified both endoscopically and by palpation using the freer elevator.  The wound was irrigated thoroughly with sterile saline solution before being closed using 4-0 Prolene interrupted sutures. A total of 10 cc of 0.5% plain Sensorcaine was injected in and around the incision before a sterile bulky dressing was applied to the wound. The patient was placed into a volar wrist splint before being awakened and returned to the recovery room in satisfactory condition after tolerating the procedure well.

## 2017-06-08 NOTE — Anesthesia Procedure Notes (Signed)
Performed by: Ras Kollman Pre-anesthesia Checklist: Patient identified, Emergency Drugs available, Suction available, Patient being monitored and Timeout performed Patient Re-evaluated:Patient Re-evaluated prior to inductionOxygen Delivery Method: Simple face mask Placement Confirmation: positive ETCO2 and breath sounds checked- equal and bilateral       

## 2017-06-09 ENCOUNTER — Encounter: Payer: Self-pay | Admitting: Surgery

## 2017-12-22 ENCOUNTER — Ambulatory Visit (INDEPENDENT_AMBULATORY_CARE_PROVIDER_SITE_OTHER): Payer: Federal, State, Local not specified - PPO | Admitting: Vascular Surgery

## 2017-12-22 ENCOUNTER — Encounter (INDEPENDENT_AMBULATORY_CARE_PROVIDER_SITE_OTHER): Payer: Self-pay | Admitting: Vascular Surgery

## 2017-12-22 VITALS — BP 131/75 | HR 88 | Resp 14 | Ht 63.0 in | Wt 167.0 lb

## 2017-12-22 DIAGNOSIS — I83813 Varicose veins of bilateral lower extremities with pain: Secondary | ICD-10-CM

## 2017-12-22 DIAGNOSIS — E1165 Type 2 diabetes mellitus with hyperglycemia: Secondary | ICD-10-CM | POA: Diagnosis not present

## 2017-12-22 DIAGNOSIS — Z794 Long term (current) use of insulin: Secondary | ICD-10-CM | POA: Diagnosis not present

## 2017-12-22 DIAGNOSIS — E7800 Pure hypercholesterolemia, unspecified: Secondary | ICD-10-CM

## 2017-12-23 ENCOUNTER — Encounter (INDEPENDENT_AMBULATORY_CARE_PROVIDER_SITE_OTHER): Payer: Self-pay | Admitting: Vascular Surgery

## 2017-12-23 DIAGNOSIS — I83813 Varicose veins of bilateral lower extremities with pain: Secondary | ICD-10-CM | POA: Insufficient documentation

## 2017-12-23 NOTE — Progress Notes (Signed)
Subjective:    Patient ID: Lisa Fletcher, female    DOB: 05/02/1946, 72 y.o.   MRN: [id number] Chief Complaint  Patient presents with  . New Patient (Initial Visit)    Pain, nagging behind the knee   Presents as a new patient self-referred for evaluation of "painful varicose veins".  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  The patient does note during those 40 years she did wear compression socks.  The patient has noticed over the last year, a progressively worsening discomfort along the varicosities located to the back of her bilateral lower extremities.  The patient notes her discomfort is worse towards the end of the day or with standing or sitting with long periods of time.  The patient notes that her symptoms have progressed to the point she is unable to function on a daily basis.  The patient denies any rest pain or ulceration to the bilateral lower extremity.  The patient's discomfort does worsen with ambulation.  At this time, the patient does not engage in conservative therapy including wearing medical grade 1 compression socks or elevating her legs.  The patient denies any recent surgery or trauma to the bilateral lower extremity.  The patient denies any fever, nausea vomiting.  Review of Systems  Constitutional: Negative.   HENT: Negative.   Eyes: Negative.   Respiratory: Negative.   Cardiovascular:       Painful varicose veins  Gastrointestinal: Negative.   Endocrine: Negative.   Genitourinary: Negative.   Musculoskeletal: Negative.   Skin: Negative.   Allergic/Immunologic: Negative.   Neurological: Negative.   Hematological: Negative.   Psychiatric/Behavioral: Negative.       Objective:   Physical Exam  Constitutional: She is oriented to person, place, and time. She appears well-developed and well-nourished. No distress.  HENT:  Head: Normocephalic and atraumatic.  Eyes: Pupils are equal,  round, and reactive to light. Conjunctivae are normal.  Neck: Normal range of motion.  Cardiovascular: Normal rate, regular rhythm, normal heart sounds and intact distal pulses.  Pulses:      Radial pulses are 2+ on the right side, and 2+ on the left side.  Hard to palpate pedal pulses however the bilateral feet are warm  Pulmonary/Chest: Effort normal and breath sounds normal.  Musculoskeletal: Normal range of motion. She exhibits edema (Mild nonpitting edema noted bilaterally).  Neurological: She is alert and oriented to person, place, and time.  Skin: She is not diaphoretic.  Less than 1 cm scattered varicosities noted to the bilateral lower extremity.  There is no stasis dermatitis or skin changes noted.  There is no active cellulitis or ulceration noted.  Psychiatric: She has a normal mood and affect. Her behavior is normal. Judgment and thought content normal.  Vitals reviewed.  BP 131/75 (BP Location: Right Arm, Patient Position: Sitting)   Pulse 88   Resp 14   Ht 5\' 3"  (1.6 m)   Wt 167 lb (75.8 kg)   BMI 29.58 kg/m   Past Medical History:  Diagnosis Date  . Diabetes mellitus, type 2 (HCC)   . Hyperlipidemia   . Hypertension   . Motion sickness    car(back seat) and circular motion  . Wears dentures    partial upper   Social History   Socioeconomic History  . Marital status: Divorced    Spouse name: Not on file  . Number of children: Not on file  .  Years of education: Not on file  . Highest education level: Not on file  Occupational History  . Not on file  Social Needs  . Financial resource strain: Not on file  . Food insecurity:    Worry: Not on file    Inability: Not on file  . Transportation needs:    Medical: Not on file    Non-medical: Not on file  Tobacco Use  . Smoking status: Never Smoker  . Smokeless tobacco: Never Used  Substance and Sexual Activity  . Alcohol use: No  . Drug use: Not on file  . Sexual activity: Not on file  Lifestyle  .  Physical activity:    Days per week: Not on file    Minutes per session: Not on file  . Stress: Not on file  Relationships  . Social connections:    Talks on phone: Not on file    Gets together: Not on file    Attends religious service: Not on file    Active member of club or organization: Not on file    Attends meetings of clubs or organizations: Not on file    Relationship status: Not on file  . Intimate partner violence:    Fear of current or ex partner: Not on file    Emotionally abused: Not on file    Physically abused: Not on file    Forced sexual activity: Not on file  Other Topics Concern  . Not on file  Social History Narrative  . Not on file   Past Surgical History:  Procedure Laterality Date  . ABDOMINAL HYSTERECTOMY    . CARPAL TUNNEL RELEASE Left 04/20/2017   Procedure: CARPAL TUNNEL RELEASE ENDOSCOPIC;  Surgeon: Christena Flake, MD;  Location: [facility];  Service: Orthopedics;  Laterality: Left;  Diabetic - insulin  . CARPAL TUNNEL RELEASE Right 06/08/2017   Procedure: CARPAL TUNNEL RELEASE ENDOSCOPIC;  Surgeon: Christena Flake, MD;  Location: [facility];  Service: Orthopedics;  Laterality: Right;  . COLONOSCOPY     History reviewed. No pertinent family history.  Allergies  Allergen Reactions  . Ancef [Cefazolin] Nausea And Vomiting  . Statins     Other reaction(s): Muscle Pain atorvastatin      Assessment & Plan:  Presents as a new patient self-referred for evaluation of "painful varicose veins".  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  The patient does note during those 40 years she did wear compression socks.  The patient has noticed over the last year, a progressively worsening discomfort along the varicosities located to the back of her bilateral lower extremities.  The patient notes her discomfort is worse towards the end of the day or with standing or sitting with  long periods of time.  The patient notes that her symptoms have progressed to the point she is unable to function on a daily basis.  The patient denies any rest pain or ulceration to the bilateral lower extremity.  The patient's discomfort does worsen with ambulation.  At this time, the patient does not engage in conservative therapy including wearing medical grade 1 compression socks or elevating her legs.  The patient denies any recent surgery or trauma to the bilateral lower extremity.  The patient denies any fever, nausea vomiting.  1. Varicose veins of bilateral lower extremities with pain - New The patient was encouraged to wear graduated compression stockings (20-30 mmHg) on a daily  basis. The patient was instructed to begin wearing the stockings first thing in the morning and removing them in the evening. The patient was instructed specifically not to sleep in the stockings. Prescription given. In addition, behavioral modification including elevation during the day will be initiated. Anti-inflammatories for pain. I will bring the patient back and have her undergo bilateral lower extremity venous reflux exam to rule out any contributing reflux disease I will also have her undergo a bilateral ABI to assess for any contributing peripheral artery disease due to faint pedal pulses and discomfort with ambulation The patient will follow up in three month to asses conservative management.  Information on compression stockings was given to the patient. The patient was instructed to call the office in the interim if any worsening edema or ulcerations to the legs, feet or toes occurs. The patient expresses their understanding.  - VAS US ABI WITH/WO TBI; Future - VAS US LOWER EXTREMITY VENOUS REFLUX; Future  2. Uncontrolled type 2 diabetes mellitus with hyperglycemia, with long-term current use of insulin (HCC) - Stable Encouraged good control as its slows the progression of atherosclerotic  disease  3. Pure hypercholesterolemia - Stable Encouraged good control as its slows the progression of atherosclerotic disease  Current Outpatient Medications on File Prior to Visit  Medication Sig Dispense Refill  . aspirin 81 MG tablet Take 81 mg by mouth daily.    Marland Kitchen. atorvastatin (LIPITOR) 80 MG tablet Take 80 mg by mouth daily.    . calcium-vitamin D (SM CALCIUM 500/VITAMIN D3) 500-400 MG-UNIT tablet Take by mouth.    . Cholecalciferol (VITAMIN D) 2000 units tablet Take by mouth.    . ezetimibe (ZETIA) 10 MG tablet Take by mouth.    . insulin NPH Human (HUMULIN N,NOVOLIN N) 100 UNIT/ML injection Inject into the skin.    Marland Kitchen. insulin regular (NOVOLIN R,HUMULIN R) 250 units/2.665mL (100 units/mL) injection Inject into the skin.    . Insulin Syringe-Needle U-100 (INSULIN SYRINGE .5CC/31GX5/16") 31G X 5/16" 0.5 ML MISC Use as directed 5 times daily.    Marland Kitchen. HYDROcodone-acetaminophen (NORCO) 5-325 MG tablet Take 1-2 tablets by mouth every 6 (six) hours as needed for moderate pain. MAXIMUM TOTAL ACETAMINOPHEN DOSE IS 4000 MG PER DAY (Patient not taking: Reported on 12/22/2017) 20 tablet 0  . lisinopril (PRINIVIL,ZESTRIL) 10 MG tablet Take by mouth.    . ondansetron (ZOFRAN ODT) 4 MG disintegrating tablet Take 1 tablet (4 mg total) by mouth every 8 (eight) hours as needed for nausea or vomiting. (Patient not taking: Reported on 12/22/2017) 20 tablet 0   No current facility-administered medications on file prior to visit.     There are no Patient Instructions on file for this visit. No follow-ups on file.   Nathanyal Ashmead A Zyrell Carmean, PA-C

## 2018-03-03 ENCOUNTER — Other Ambulatory Visit: Payer: Self-pay | Admitting: Internal Medicine

## 2018-03-03 DIAGNOSIS — Z1231 Encounter for screening mammogram for malignant neoplasm of breast: Secondary | ICD-10-CM

## 2018-03-23 ENCOUNTER — Encounter (INDEPENDENT_AMBULATORY_CARE_PROVIDER_SITE_OTHER): Payer: Federal, State, Local not specified - PPO

## 2018-03-23 ENCOUNTER — Ambulatory Visit (INDEPENDENT_AMBULATORY_CARE_PROVIDER_SITE_OTHER): Payer: Federal, State, Local not specified - PPO | Admitting: Vascular Surgery

## 2018-04-07 ENCOUNTER — Ambulatory Visit
Admission: RE | Admit: 2018-04-07 | Discharge: 2018-04-07 | Disposition: A | Discharge status: Home / Self Care | Payer: Federal, State, Local not specified - PPO | Source: Ambulatory Visit | Attending: Internal Medicine | Admitting: Internal Medicine

## 2018-04-07 DIAGNOSIS — Z1231 Encounter for screening mammogram for malignant neoplasm of breast: Secondary | ICD-10-CM | POA: Diagnosis present

## 2018-06-22 ENCOUNTER — Ambulatory Visit (INDEPENDENT_AMBULATORY_CARE_PROVIDER_SITE_OTHER): Payer: Federal, State, Local not specified - PPO | Admitting: Vascular Surgery

## 2018-06-22 ENCOUNTER — Encounter (INDEPENDENT_AMBULATORY_CARE_PROVIDER_SITE_OTHER): Payer: Self-pay | Admitting: Vascular Surgery

## 2018-06-22 ENCOUNTER — Ambulatory Visit (INDEPENDENT_AMBULATORY_CARE_PROVIDER_SITE_OTHER): Payer: Federal, State, Local not specified - PPO

## 2018-06-22 VITALS — BP 135/79 | HR 92 | Resp 16 | Ht 64.0 in | Wt 169.8 lb

## 2018-06-22 DIAGNOSIS — E1165 Type 2 diabetes mellitus with hyperglycemia: Secondary | ICD-10-CM | POA: Diagnosis not present

## 2018-06-22 DIAGNOSIS — E119 Type 2 diabetes mellitus without complications: Secondary | ICD-10-CM

## 2018-06-22 DIAGNOSIS — I83813 Varicose veins of bilateral lower extremities with pain: Secondary | ICD-10-CM

## 2018-06-22 DIAGNOSIS — I10 Essential (primary) hypertension: Secondary | ICD-10-CM

## 2018-06-22 DIAGNOSIS — Z794 Long term (current) use of insulin: Secondary | ICD-10-CM | POA: Diagnosis not present

## 2018-06-30 ENCOUNTER — Encounter (INDEPENDENT_AMBULATORY_CARE_PROVIDER_SITE_OTHER): Payer: Self-pay | Admitting: Vascular Surgery

## 2018-06-30 NOTE — Progress Notes (Signed)
MRN : [id number]  Lisa Fletcher is a 72 y.o. (07-Jul-1946) female who presents with chief complaint of  Chief Complaint  Patient presents with  . Follow-up    47month abi,bil ven reflux ultrasound  .  History of Present Illness:   The patient returns for followup evaluation after the initial visit. The patient continues to have pain in the lower extremities with dependency. The pain is lessened with elevation. Graduated compression stockings, Class I (20-30 mmHg), have been worn but the stockings do not eliminate the leg pain. Over-the-counter analgesics do not improve the symptoms. The degree of discomfort continues to interfere with daily activities. The patient notes the pain in the legs is causing problems with daily exercise, at the workplace and even with household activities and maintenance such as standing in the kitchen preparing meals and doing dishes.   Venous ultrasound shows normal deep venous system, no evidence of acute or chronic DVT.  Superficial reflux is not present. Current Meds  Medication Sig  . aspirin 81 MG tablet Take 81 mg by mouth daily.  . calcium-vitamin D (SM CALCIUM 500/VITAMIN D3) 500-400 MG-UNIT tablet Take by mouth.  . Cholecalciferol (VITAMIN D) 2000 units tablet Take by mouth.  . insulin NPH Human (HUMULIN N,NOVOLIN N) 100 UNIT/ML injection Inject into the skin.  . Insulin Syringe-Needle U-100 (INSULIN SYRINGE .5CC/31GX5/16") 31G X 5/16" 0.5 ML MISC Use as directed 5 times daily.    Past Medical History:  Diagnosis Date  . Diabetes mellitus, type 2 (HCC)   . Hyperlipidemia   . Hypertension   . Motion sickness    car(back seat) and circular motion  . Wears dentures    partial upper    Past Surgical History:  Procedure Laterality Date  . ABDOMINAL HYSTERECTOMY    . CARPAL TUNNEL RELEASE Left 04/20/2017   Procedure: CARPAL TUNNEL RELEASE ENDOSCOPIC;  Surgeon: Christena Flake, MD;  Location: [facility];  Service: Orthopedics;  Laterality:  Left;  Diabetic - insulin  . CARPAL TUNNEL RELEASE Right 06/08/2017   Procedure: CARPAL TUNNEL RELEASE ENDOSCOPIC;  Surgeon: Christena Flake, MD;  Location: [facility];  Service: Orthopedics;  Laterality: Right;  . COLONOSCOPY      Social History Social History   Tobacco Use  . Smoking status: Never Smoker  . Smokeless tobacco: Never Used  Substance Use Topics  . Alcohol use: No  . Drug use: Never    Family History Family History  Problem Relation Age of Onset  . Cancer Sister   . Diabetes Sister   . Hypertension Sister   . Deep vein thrombosis Brother   . Diabetes Brother   . Hypertension Brother     Allergies  Allergen Reactions  . Ancef [Cefazolin] Nausea And Vomiting  . Statins     Other reaction(s): Muscle Pain atorvastatin     REVIEW OF SYSTEMS (Negative unless checked)  Constitutional: [] Weight loss  [] Fever  [] Chills Cardiac: [] Chest pain   [] Chest pressure   [] Palpitations   [] Shortness of breath when laying flat   [] Shortness of breath with exertion. Vascular:  [] Pain in legs with walking   [x] Pain in legs at rest  [] History of DVT   [] Phlebitis   [x] Swelling in legs   [x] Varicose veins   [] Non-healing ulcers Pulmonary:   [] Uses home oxygen   [] Productive cough   [] Hemoptysis   [] Wheeze  [] COPD   [] Asthma Neurologic:  [] Dizziness   [] Seizures   [] History of stroke   [] History  of TIA  [] Aphasia   [] Vissual changes   [] Weakness or numbness in arm   [] Weakness or numbness in leg Musculoskeletal:   [] Joint swelling   [x] Joint pain   [] Low back pain Hematologic:  [] Easy bruising  [] Easy bleeding   [] Hypercoagulable state   [] Anemic Gastrointestinal:  [] Diarrhea   [] Vomiting  [] Gastroesophageal reflux/heartburn   [] Difficulty swallowing. Genitourinary:  [] Chronic kidney disease   [] Difficult urination  [] Frequent urination   [] Blood in urine Skin:  [] Rashes   [] Ulcers  Psychological:  [] History of anxiety   []  History of major depression.  Physical  Examination  Vitals:   06/22/18 1516  BP: 135/79  Pulse: 92  Resp: 16  Weight: 169 lb 12.8 oz (77 kg)  Height: 5\' 4"  (1.626 m)   Body mass index is 29.15 kg/m. Gen: WD/WN, NAD Head: [place]/AT, No temporalis wasting.  Ear/Nose/Throat: Hearing grossly intact, nares w/o erythema or drainage Eyes: PER, EOMI, sclera nonicteric.  Neck: Supple, no large masses.   Pulmonary:  Good air movement, no audible wheezing bilaterally, no use of accessory muscles.  Cardiac: RRR, no JVD Vascular: Large varicosities present extensively greater than 5 mm bilaterally.  Mild venous stasis changes to the legs bilaterally.  1-2+ soft pitting edema Vessel Right Left  Radial Palpable Palpable  PT Palpable Palpable  DP Palpable Palpable  Gastrointestinal: Non-distended. No guarding/no peritoneal signs.  Musculoskeletal: M/S 5/5 throughout.  No deformity or atrophy.  Neurologic: CN 2-12 intact. Symmetrical.  Speech is fluent. Motor exam as listed above. Psychiatric: Judgment intact, Mood & affect appropriate for pt's clinical situation. Dermatologic: No rashes or ulcers noted.  No changes consistent with cellulitis. Lymph : No lichenification or skin changes of chronic lymphedema.  CBC No results found for: WBC, HGB, HCT, MCV, PLT  BMET No results found for: NA, K, CL, CO2, GLUCOSE, BUN, CREATININE, CALCIUM, GFRNONAA, GFRAA CrCl cannot be calculated (No successful lab value found.).  COAG No results found for: INR, PROTIME  Radiology No results found.   Assessment/Plan 1. Varicose veins of bilateral lower extremities with pain Recommend:  The patient is complaining of varicose veins.    I have had a long discussion with the patient regarding  varicose veins and why they cause symptoms.  Patient will begin wearing graduated compression stockings on a daily basis, beginning first thing in the morning and removing them in the evening. The patient is instructed specifically not to sleep in the  stockings.    The patient  will also begin using over-the-counter analgesics such as Motrin 600 mg po TID to help control the symptoms as needed.    In addition, behavioral modification including elevation during the day will be initiated, utilizing a recliner was recommended.  The patient is also instructed to continue exercising such as walking 4-5 times per week.  At this time the patient wishes to continue conservative therapy and is not interested in more invasive treatments such as laser ablation and sclerotherapy.  The Patient will follow up PRN if the symptoms worsen.  2. Essential hypertension with goal blood pressure less than 140/90 Continue antihypertensive medications as already ordered, these medications have been reviewed and there are no changes at this time.   3. Uncontrolled type 2 diabetes mellitus with hyperglycemia, with long-term current use of insulin (HCC) Continue hypoglycemic medications as already ordered, these medications have been reviewed and there are no changes at this time.  Hgb A1C to be monitored as already arranged by primary service   4.  Controlled type 2 diabetes mellitus without complication, with long-term current use of insulin (HCC) See #3    Levora Dredge, MD  06/30/2018 1:54 PM

## 2019-03-15 ENCOUNTER — Other Ambulatory Visit: Payer: Self-pay | Admitting: Internal Medicine

## 2019-03-15 DIAGNOSIS — Z1231 Encounter for screening mammogram for malignant neoplasm of breast: Secondary | ICD-10-CM

## 2019-04-23 ENCOUNTER — Other Ambulatory Visit: Payer: Self-pay

## 2019-04-23 ENCOUNTER — Ambulatory Visit
Admission: RE | Admit: 2019-04-23 | Discharge: 2019-04-23 | Disposition: A | Discharge status: Home / Self Care | Payer: Federal, State, Local not specified - PPO | Source: Ambulatory Visit | Attending: Internal Medicine | Admitting: Internal Medicine

## 2019-04-23 DIAGNOSIS — Z1231 Encounter for screening mammogram for malignant neoplasm of breast: Secondary | ICD-10-CM | POA: Diagnosis not present

## 2019-06-25 DIAGNOSIS — E113299 Type 2 diabetes mellitus with mild nonproliferative diabetic retinopathy without macular edema, unspecified eye: Secondary | ICD-10-CM | POA: Insufficient documentation

## 2019-07-03 ENCOUNTER — Encounter: Payer: Self-pay | Admitting: *Deleted

## 2019-07-03 ENCOUNTER — Other Ambulatory Visit: Payer: Self-pay

## 2019-07-06 ENCOUNTER — Other Ambulatory Visit: Payer: Self-pay

## 2019-07-06 ENCOUNTER — Other Ambulatory Visit
Admission: RE | Admit: 2019-07-06 | Discharge: 2019-07-06 | Disposition: A | Discharge status: Home / Self Care | Payer: Federal, State, Local not specified - PPO | Source: Ambulatory Visit | Attending: Ophthalmology | Admitting: Ophthalmology

## 2019-07-06 DIAGNOSIS — Z01812 Encounter for preprocedural laboratory examination: Secondary | ICD-10-CM | POA: Diagnosis not present

## 2019-07-06 DIAGNOSIS — Z20828 Contact with and (suspected) exposure to other viral communicable diseases: Secondary | ICD-10-CM | POA: Diagnosis not present

## 2019-07-06 LAB — SARS CORONAVIRUS 2 (TAT 6-24 HRS): SARS Coronavirus 2: NEGATIVE

## 2019-07-06 NOTE — Discharge Instructions (Signed)

## 2019-07-10 ENCOUNTER — Ambulatory Visit
Admission: RE | Admit: 2019-07-10 | Discharge: 2019-07-10 | Disposition: A | Discharge status: Home / Self Care | Payer: Federal, State, Local not specified - PPO | Attending: Ophthalmology | Admitting: Ophthalmology

## 2019-07-10 ENCOUNTER — Ambulatory Visit: Payer: Federal, State, Local not specified - PPO | Admitting: Anesthesiology

## 2019-07-10 ENCOUNTER — Encounter
Admission: RE | Disposition: A | Discharge status: Home / Self Care | Payer: Self-pay | Source: Home / Self Care | Attending: Ophthalmology

## 2019-07-10 DIAGNOSIS — Z6830 Body mass index (BMI) 30.0-30.9, adult: Secondary | ICD-10-CM | POA: Diagnosis not present

## 2019-07-10 DIAGNOSIS — H2511 Age-related nuclear cataract, right eye: Secondary | ICD-10-CM | POA: Insufficient documentation

## 2019-07-10 DIAGNOSIS — Z794 Long term (current) use of insulin: Secondary | ICD-10-CM | POA: Diagnosis not present

## 2019-07-10 DIAGNOSIS — I10 Essential (primary) hypertension: Secondary | ICD-10-CM | POA: Diagnosis not present

## 2019-07-10 DIAGNOSIS — E1136 Type 2 diabetes mellitus with diabetic cataract: Secondary | ICD-10-CM | POA: Insufficient documentation

## 2019-07-10 DIAGNOSIS — E669 Obesity, unspecified: Secondary | ICD-10-CM | POA: Diagnosis not present

## 2019-07-10 DIAGNOSIS — Z79899 Other long term (current) drug therapy: Secondary | ICD-10-CM | POA: Diagnosis not present

## 2019-07-10 DIAGNOSIS — Z7982 Long term (current) use of aspirin: Secondary | ICD-10-CM | POA: Insufficient documentation

## 2019-07-10 HISTORY — PX: CATARACT EXTRACTION W/PHACO: SHX586

## 2019-07-10 LAB — GLUCOSE, CAPILLARY
Glucose-Capillary: 160 mg/dL — ABNORMAL HIGH (ref 70–99)
Glucose-Capillary: 165 mg/dL — ABNORMAL HIGH (ref 70–99)

## 2019-07-10 SURGERY — PHACOEMULSIFICATION, CATARACT, WITH IOL INSERTION
Anesthesia: Monitor Anesthesia Care | Site: Eye | Laterality: Right

## 2019-07-10 MED ORDER — NA CHONDROIT SULF-NA HYALURON 40-17 MG/ML IO SOLN
INTRAOCULAR | Status: DC | PRN
  Administered 2019-07-10: 1 mL via INTRAOCULAR

## 2019-07-10 MED ORDER — EPINEPHRINE PF 1 MG/ML IJ SOLN
INTRAOCULAR | Status: DC | PRN
  Administered 2019-07-10: 62 mL via OPHTHALMIC

## 2019-07-10 MED ORDER — ONDANSETRON HCL 4 MG/2ML IJ SOLN
INTRAMUSCULAR | Status: DC | PRN
  Administered 2019-07-10: 4 mg via INTRAVENOUS

## 2019-07-10 MED ORDER — FENTANYL CITRATE (PF) 100 MCG/2ML IJ SOLN
INTRAMUSCULAR | Status: DC | PRN
  Administered 2019-07-10 (×2): 50 ug via INTRAVENOUS

## 2019-07-10 MED ORDER — BRIMONIDINE TARTRATE-TIMOLOL 0.2-0.5 % OP SOLN
OPHTHALMIC | Status: DC | PRN
  Administered 2019-07-10: 1 [drp] via OPHTHALMIC

## 2019-07-10 MED ORDER — MIDAZOLAM HCL 2 MG/2ML IJ SOLN
INTRAMUSCULAR | Status: DC | PRN
  Administered 2019-07-10: 2 mg via INTRAVENOUS

## 2019-07-10 MED ORDER — TETRACAINE HCL 0.5 % OP SOLN
1.0000 [drp] | OPHTHALMIC | Status: DC | PRN
  Administered 2019-07-10 (×3): 1 [drp] via OPHTHALMIC

## 2019-07-10 MED ORDER — LACTATED RINGERS IV SOLN: INTRAVENOUS | Status: DC

## 2019-07-10 MED ORDER — ARMC OPHTHALMIC DILATING DROPS
1.0000 "application " | OPHTHALMIC | Status: DC | PRN
  Administered 2019-07-10 (×3): 1 via OPHTHALMIC

## 2019-07-10 MED ORDER — ONDANSETRON HCL 4 MG/2ML IJ SOLN
4.0000 mg | Freq: Once | INTRAMUSCULAR | Status: AC
  Administered 2019-07-10: 07:00:00 4 mg via INTRAVENOUS

## 2019-07-10 MED ORDER — MOXIFLOXACIN HCL 0.5 % OP SOLN
OPHTHALMIC | Status: DC | PRN
  Administered 2019-07-10: 0.2 mL via OPHTHALMIC

## 2019-07-10 MED ORDER — LIDOCAINE HCL (PF) 2 % IJ SOLN
INTRAOCULAR | Status: DC | PRN
  Administered 2019-07-10: 1 mL

## 2019-07-10 SURGICAL SUPPLY — 20 items
CANNULA ANT/CHMB 27G (MISCELLANEOUS) ×2 IMPLANT
CANNULA ANT/CHMB 27GA (MISCELLANEOUS) ×6 IMPLANT
GLOVE SURG LX 8.0 MICRO (GLOVE) ×2
GLOVE SURG LX STRL 8.0 MICRO (GLOVE) ×1 IMPLANT
GLOVE SURG TRIUMPH 8.0 PF LTX (GLOVE) ×3 IMPLANT
GOWN STRL REUS W/ TWL LRG LVL3 (GOWN DISPOSABLE) ×2 IMPLANT
GOWN STRL REUS W/TWL LRG LVL3 (GOWN DISPOSABLE) ×4
LENS IOL TECNIS ITEC 22.0 (Intraocular Lens) ×2 IMPLANT
MARKER SKIN DUAL TIP RULER LAB (MISCELLANEOUS) ×3 IMPLANT
NDL FILTER BLUNT 18X1 1/2 (NEEDLE) ×1 IMPLANT
NDL RETROBULBAR .5 NSTRL (NEEDLE) ×3 IMPLANT
NEEDLE FILTER BLUNT 18X 1/2SAF (NEEDLE) ×2
NEEDLE FILTER BLUNT 18X1 1/2 (NEEDLE) ×1 IMPLANT
PACK EYE AFTER SURG (MISCELLANEOUS) ×3 IMPLANT
PACK OPTHALMIC (MISCELLANEOUS) ×3 IMPLANT
PACK PORFILIO (MISCELLANEOUS) ×3 IMPLANT
SYR 3ML LL SCALE MARK (SYRINGE) ×3 IMPLANT
SYR TB 1ML LUER SLIP (SYRINGE) ×3 IMPLANT
WATER STERILE IRR 250ML POUR (IV SOLUTION) ×3 IMPLANT
WIPE NON LINTING 3.25X3.25 (MISCELLANEOUS) ×3 IMPLANT

## 2019-07-10 NOTE — Anesthesia Preprocedure Evaluation (Signed)
Anesthesia Evaluation  Patient identified by MRN, date of birth, ID band Patient awake    Airway Mallampati: II  TM Distance: >3 FB     Dental  (+) Partial Upper   Pulmonary    breath sounds clear to auscultation       Cardiovascular hypertension,  Rhythm:Regular Rate:Normal  HLD   Neuro/Psych    GI/Hepatic   Endo/Other  diabetes, Type 2Obesity - BMI 30  Renal/GU      Musculoskeletal   Abdominal   Peds  Hematology   Anesthesia Other Findings   Reproductive/Obstetrics                             Anesthesia Physical Anesthesia Plan  ASA: II  Anesthesia Plan: MAC   Post-op Pain Management:    Induction:   PONV Risk Score and Plan:   Airway Management Planned: Nasal Cannula and Natural Airway  Additional Equipment:   Intra-op Plan:   Post-operative Plan:   Informed Consent: I have reviewed the patients History and Physical, chart, labs and discussed the procedure including the risks, benefits and alternatives for the proposed anesthesia with the patient or authorized representative who has indicated his/her understanding and acceptance.       Plan Discussed with: CRNA  Anesthesia Plan Comments:         Anesthesia Quick Evaluation

## 2019-07-10 NOTE — Op Note (Signed)
PREOPERATIVE DIAGNOSIS:  Nuclear sclerotic cataract of the right eye.   POSTOPERATIVE DIAGNOSIS:  H25.11 Cataract   OPERATIVE PROCEDURE:@   SURGEON:  Birder Robson, MD.   ANESTHESIA:  Anesthesiologist: Veda Canning, MD CRNA: Jeannene Patella, CRNA  1.      Managed anesthesia care. 2.      0.92ml of Shugarcaine was instilled in the eye following the paracentesis.   COMPLICATIONS:  None.   TECHNIQUE:   Stop and chop   DESCRIPTION OF PROCEDURE:  The patient was examined and consented in the preoperative holding area where the aforementioned topical anesthesia was applied to the right eye and then brought back to the Operating Room where the right eye was prepped and draped in the usual sterile ophthalmic fashion and a lid speculum was placed. A paracentesis was created with the side port blade and the anterior chamber was filled with viscoelastic. A near clear corneal incision was performed with the steel keratome. A continuous curvilinear capsulorrhexis was performed with a cystotome followed by the capsulorrhexis forceps. Hydrodissection and hydrodelineation were carried out with BSS on a blunt cannula. The lens was removed in a stop and chop  technique and the remaining cortical material was removed with the irrigation-aspiration handpiece. The capsular bag was inflated with viscoelastic and the Technis [id number]  lens was placed in the capsular bag without complication. The remaining viscoelastic was removed from the eye with the irrigation-aspiration handpiece. The wounds were hydrated. The anterior chamber was flushed with BSS and the eye was inflated to physiologic pressure. 0.66ml of Vigamox was placed in the anterior chamber. The wounds were found to be water tight. The eye was dressed with Combigan. The patient was given protective glasses to wear throughout the day and a shield with which to sleep tonight. The patient was also given drops with which to begin a drop regimen today and will  follow-up with me in one day. Implant Name Type Inv. Item Serial No. Manufacturer Lot No. LRB No. Used Action  LENS IOL DIOP 22.0 - [id number] Intraocular Lens LENS IOL DIOP 22.0 [id number] AMO  Right 1 Implanted   Procedure(s) with comments: CATARACT EXTRACTION PHACO AND INTRAOCULAR LENS PLACEMENT (IOC) RIGHT DIABETIC  01:04.9  15.3%  9.97 (Right) - diabetic - insulin  Electronically signed: Birder Robson 07/10/2019 7:51 AM

## 2019-07-10 NOTE — Anesthesia Postprocedure Evaluation (Signed)
Anesthesia Post Note  Patient: Lisa Fletcher  Procedure(s) Performed: CATARACT EXTRACTION PHACO AND INTRAOCULAR LENS PLACEMENT (IOC) RIGHT DIABETIC  01:04.9  15.3%  9.97 (Right Eye)  Patient location during evaluation: PACU Anesthesia Type: MAC Level of consciousness: awake and alert Pain management: pain level controlled Vital Signs Assessment: post-procedure vital signs reviewed and stable Respiratory status: spontaneous breathing, nonlabored ventilation, respiratory function stable and patient connected to nasal cannula oxygen Cardiovascular status: stable and blood pressure returned to baseline Postop Assessment: no apparent nausea or vomiting Anesthetic complications: no    Veda Canning

## 2019-07-10 NOTE — Transfer of Care (Signed)
Immediate Anesthesia Transfer of Care Note  Patient: Lisa Fletcher  Procedure(s) Performed: CATARACT EXTRACTION PHACO AND INTRAOCULAR LENS PLACEMENT (IOC) RIGHT DIABETIC  01:04.9  15.3%  9.97 (Right Eye)  Patient Location: PACU  Anesthesia Type: MAC  Level of Consciousness: awake, alert  and patient cooperative  Airway and Oxygen Therapy: Patient Spontanous Breathing and Patient connected to supplemental oxygen  Post-op Assessment: Post-op Vital signs reviewed, Patient's Cardiovascular Status Stable, Respiratory Function Stable, Patent Airway and No signs of Nausea or vomiting  Post-op Vital Signs: Reviewed and stable  Complications: No apparent anesthesia complications

## 2019-07-10 NOTE — H&P (Signed)
All labs reviewed. Abnormal studies sent to patients PCP when indicated.  Previous H&P reviewed, patient examined, there are NO CHANGES.  Lisa Juba Porfilio10/13/20207:25 AM

## 2019-07-11 ENCOUNTER — Encounter: Payer: Self-pay | Admitting: Ophthalmology

## 2019-09-11 ENCOUNTER — Ambulatory Visit: Admit: 2019-09-11 | Payer: Federal, State, Local not specified - PPO | Admitting: Ophthalmology

## 2019-09-11 SURGERY — PHACOEMULSIFICATION, CATARACT, WITH IOL INSERTION
Anesthesia: Topical | Laterality: Left

## 2020-03-06 DIAGNOSIS — E538 Deficiency of other specified B group vitamins: Secondary | ICD-10-CM | POA: Insufficient documentation

## 2020-04-08 ENCOUNTER — Other Ambulatory Visit: Payer: Self-pay | Admitting: Internal Medicine

## 2020-04-08 DIAGNOSIS — Z1231 Encounter for screening mammogram for malignant neoplasm of breast: Secondary | ICD-10-CM

## 2020-04-29 ENCOUNTER — Ambulatory Visit
Admission: RE | Admit: 2020-04-29 | Discharge: 2020-04-29 | Disposition: A | Discharge status: Home / Self Care | Payer: Federal, State, Local not specified - PPO | Source: Ambulatory Visit | Attending: Internal Medicine | Admitting: Internal Medicine

## 2020-04-29 DIAGNOSIS — Z1231 Encounter for screening mammogram for malignant neoplasm of breast: Secondary | ICD-10-CM | POA: Diagnosis not present

## 2020-09-24 ENCOUNTER — Encounter: Payer: Self-pay | Admitting: Family Medicine

## 2020-11-27 ENCOUNTER — Encounter: Payer: Self-pay | Admitting: Ophthalmology

## 2020-11-27 ENCOUNTER — Other Ambulatory Visit: Payer: Self-pay

## 2020-11-28 ENCOUNTER — Other Ambulatory Visit
Admission: RE | Admit: 2020-11-28 | Discharge: 2020-11-28 | Disposition: A | Discharge status: Home / Self Care | Payer: Federal, State, Local not specified - PPO | Source: Ambulatory Visit | Attending: Ophthalmology | Admitting: Ophthalmology

## 2020-11-28 DIAGNOSIS — Z01812 Encounter for preprocedural laboratory examination: Secondary | ICD-10-CM | POA: Diagnosis not present

## 2020-11-28 DIAGNOSIS — Z20822 Contact with and (suspected) exposure to covid-19: Secondary | ICD-10-CM | POA: Diagnosis not present

## 2020-11-28 NOTE — Discharge Instructions (Signed)

## 2020-11-29 LAB — SARS CORONAVIRUS 2 (TAT 6-24 HRS): SARS Coronavirus 2: NEGATIVE

## 2020-12-02 ENCOUNTER — Ambulatory Visit: Payer: Federal, State, Local not specified - PPO | Admitting: Anesthesiology

## 2020-12-02 ENCOUNTER — Encounter: Payer: Self-pay | Admitting: Ophthalmology

## 2020-12-02 ENCOUNTER — Encounter
Admission: RE | Disposition: A | Discharge status: Home / Self Care | Payer: Self-pay | Source: Home / Self Care | Attending: Ophthalmology

## 2020-12-02 ENCOUNTER — Other Ambulatory Visit: Payer: Self-pay

## 2020-12-02 ENCOUNTER — Ambulatory Visit
Admission: RE | Admit: 2020-12-02 | Discharge: 2020-12-02 | Disposition: A | Discharge status: Home / Self Care | Payer: Federal, State, Local not specified - PPO | Attending: Ophthalmology | Admitting: Ophthalmology

## 2020-12-02 DIAGNOSIS — H2512 Age-related nuclear cataract, left eye: Secondary | ICD-10-CM | POA: Insufficient documentation

## 2020-12-02 DIAGNOSIS — Z9841 Cataract extraction status, right eye: Secondary | ICD-10-CM | POA: Diagnosis not present

## 2020-12-02 DIAGNOSIS — Z881 Allergy status to other antibiotic agents status: Secondary | ICD-10-CM | POA: Insufficient documentation

## 2020-12-02 DIAGNOSIS — Z888 Allergy status to other drugs, medicaments and biological substances status: Secondary | ICD-10-CM | POA: Diagnosis not present

## 2020-12-02 DIAGNOSIS — Z7982 Long term (current) use of aspirin: Secondary | ICD-10-CM | POA: Diagnosis not present

## 2020-12-02 DIAGNOSIS — Z833 Family history of diabetes mellitus: Secondary | ICD-10-CM | POA: Insufficient documentation

## 2020-12-02 DIAGNOSIS — Z8249 Family history of ischemic heart disease and other diseases of the circulatory system: Secondary | ICD-10-CM | POA: Insufficient documentation

## 2020-12-02 DIAGNOSIS — Z794 Long term (current) use of insulin: Secondary | ICD-10-CM | POA: Insufficient documentation

## 2020-12-02 DIAGNOSIS — Z961 Presence of intraocular lens: Secondary | ICD-10-CM | POA: Insufficient documentation

## 2020-12-02 DIAGNOSIS — E1136 Type 2 diabetes mellitus with diabetic cataract: Secondary | ICD-10-CM | POA: Insufficient documentation

## 2020-12-02 DIAGNOSIS — Z79899 Other long term (current) drug therapy: Secondary | ICD-10-CM | POA: Insufficient documentation

## 2020-12-02 DIAGNOSIS — Z803 Family history of malignant neoplasm of breast: Secondary | ICD-10-CM | POA: Diagnosis not present

## 2020-12-02 HISTORY — PX: CATARACT EXTRACTION W/PHACO: SHX586

## 2020-12-02 LAB — GLUCOSE, CAPILLARY
Glucose-Capillary: 91 mg/dL (ref 70–99)
Glucose-Capillary: 101 mg/dL — ABNORMAL HIGH (ref 70–99)

## 2020-12-02 SURGERY — PHACOEMULSIFICATION, CATARACT, WITH IOL INSERTION
Anesthesia: Monitor Anesthesia Care | Site: Eye | Laterality: Left

## 2020-12-02 MED ORDER — CYCLOPENTOLATE HCL 2 % OP SOLN
1.0000 [drp] | OPHTHALMIC | Status: DC | PRN
  Administered 2020-12-02 (×3): 1 [drp] via OPHTHALMIC

## 2020-12-02 MED ORDER — BRIMONIDINE TARTRATE-TIMOLOL 0.2-0.5 % OP SOLN
OPHTHALMIC | Status: DC | PRN
  Administered 2020-12-02: 1 [drp] via OPHTHALMIC

## 2020-12-02 MED ORDER — NA CHONDROIT SULF-NA HYALURON 40-17 MG/ML IO SOLN
INTRAOCULAR | Status: DC | PRN
  Administered 2020-12-02: 1 mL via INTRAOCULAR

## 2020-12-02 MED ORDER — LIDOCAINE HCL (PF) 2 % IJ SOLN
INTRAOCULAR | Status: DC | PRN
  Administered 2020-12-02: 1 mL

## 2020-12-02 MED ORDER — TETRACAINE HCL 0.5 % OP SOLN
1.0000 [drp] | OPHTHALMIC | Status: DC | PRN
  Administered 2020-12-02 (×3): 1 [drp] via OPHTHALMIC

## 2020-12-02 MED ORDER — PHENYLEPHRINE HCL 10 % OP SOLN
1.0000 [drp] | OPHTHALMIC | Status: DC | PRN
  Administered 2020-12-02 (×3): 1 [drp] via OPHTHALMIC

## 2020-12-02 MED ORDER — MIDAZOLAM HCL 2 MG/2ML IJ SOLN
INTRAMUSCULAR | Status: DC | PRN
  Administered 2020-12-02 (×2): 1 mg via INTRAVENOUS

## 2020-12-02 MED ORDER — FENTANYL CITRATE (PF) 100 MCG/2ML IJ SOLN
INTRAMUSCULAR | Status: DC | PRN
  Administered 2020-12-02 (×2): 50 ug via INTRAVENOUS

## 2020-12-02 MED ORDER — MOXIFLOXACIN HCL 0.5 % OP SOLN
OPHTHALMIC | Status: DC | PRN
  Administered 2020-12-02: 0.2 mL via OPHTHALMIC

## 2020-12-02 MED ORDER — EPINEPHRINE PF 1 MG/ML IJ SOLN
INTRAOCULAR | Status: DC | PRN
  Administered 2020-12-02: 49 mL via OPHTHALMIC

## 2020-12-02 SURGICAL SUPPLY — 19 items
CANNULA ANT/CHMB 27G (MISCELLANEOUS) ×2 IMPLANT
CANNULA ANT/CHMB 27GA (MISCELLANEOUS) ×4 IMPLANT
GLOVE SURG LX 8.0 MICRO (GLOVE) ×1
GLOVE SURG LX STRL 8.0 MICRO (GLOVE) ×1 IMPLANT
GLOVE SURG TRIUMPH 8.0 PF LTX (GLOVE) ×2 IMPLANT
GOWN STRL REUS W/ TWL LRG LVL3 (GOWN DISPOSABLE) ×2 IMPLANT
GOWN STRL REUS W/TWL LRG LVL3 (GOWN DISPOSABLE) ×4
LENS IOL TECNIS EYHANCE 22.0 (Intraocular Lens) ×1 IMPLANT
MARKER SKIN DUAL TIP RULER LAB (MISCELLANEOUS) ×2 IMPLANT
NDL FILTER BLUNT 18X1 1/2 (NEEDLE) ×1 IMPLANT
NEEDLE FILTER BLUNT 18X 1/2SAF (NEEDLE) ×1
NEEDLE FILTER BLUNT 18X1 1/2 (NEEDLE) ×1 IMPLANT
PACK EYE AFTER SURG (MISCELLANEOUS) ×2 IMPLANT
PACK OPTHALMIC (MISCELLANEOUS) ×2 IMPLANT
PACK PORFILIO (MISCELLANEOUS) ×2 IMPLANT
SYR 3ML LL SCALE MARK (SYRINGE) ×2 IMPLANT
SYR TB 1ML LUER SLIP (SYRINGE) ×2 IMPLANT
WATER STERILE IRR 250ML POUR (IV SOLUTION) ×2 IMPLANT
WIPE NON LINTING 3.25X3.25 (MISCELLANEOUS) ×2 IMPLANT

## 2020-12-02 NOTE — Anesthesia Procedure Notes (Signed)
Procedure Name: MAC Date/Time: 12/02/2020 10:06 AM Performed by: Cameron Ali, CRNA Pre-anesthesia Checklist: Patient identified, Emergency Drugs available, Suction available, Timeout performed and Patient being monitored Patient Re-evaluated:Patient Re-evaluated prior to induction Oxygen Delivery Method: Nasal cannula Placement Confirmation: positive ETCO2

## 2020-12-02 NOTE — Anesthesia Preprocedure Evaluation (Signed)
Anesthesia Evaluation  Patient identified by MRN, date of birth, ID band Patient awake    History of Anesthesia Complications Negative for: history of anesthetic complications  Airway Mallampati: II  TM Distance: >3 FB Neck ROM: Full    Dental  (+) Partial Upper   Pulmonary neg pulmonary ROS,    Pulmonary exam normal        Cardiovascular Exercise Tolerance: Good hypertension, Normal cardiovascular exam  Stress echo 11/04/20 normal    Neuro/Psych    GI/Hepatic negative GI ROS, Neg liver ROS,   Endo/Other  diabetes, Well Controlled, Type 2, Insulin Dependent  Renal/GU      Musculoskeletal   Abdominal   Peds  Hematology negative hematology ROS (+)   Anesthesia Other Findings   Reproductive/Obstetrics                             Anesthesia Physical Anesthesia Plan  ASA: II  Anesthesia Plan: MAC   Post-op Pain Management:    Induction: Intravenous  PONV Risk Score and Plan: 2 and TIVA, Midazolam and Treatment may vary due to age or medical condition  Airway Management Planned: Nasal Cannula and Natural Airway  Additional Equipment: None  Intra-op Plan:   Post-operative Plan:   Informed Consent: I have reviewed the patients History and Physical, chart, labs and discussed the procedure including the risks, benefits and alternatives for the proposed anesthesia with the patient or authorized representative who has indicated his/her understanding and acceptance.       Plan Discussed with: CRNA  Anesthesia Plan Comments:         Anesthesia Quick Evaluation

## 2020-12-02 NOTE — H&P (Signed)
[facility]   Primary Care Physician:  Lauro Regulus, MD Ophthalmologist: Dr. Druscilla Brownie  Pre-Procedure History & Physical: HPI:  Lisa Fletcher is a 75 y.o. female here for cataract surgery.   Past Medical History:  Diagnosis Date  . Diabetes mellitus, type 2 (HCC)   . Hyperlipidemia   . Hypertension   . Motion sickness    car(back seat) and circular motion  . Wears dentures    partial upper    Past Surgical History:  Procedure Laterality Date  . ABDOMINAL HYSTERECTOMY    . CARPAL TUNNEL RELEASE Left 04/20/2017   Procedure: CARPAL TUNNEL RELEASE ENDOSCOPIC;  Surgeon: Christena Flake, MD;  Location: [facility];  Service: Orthopedics;  Laterality: Left;  Diabetic - insulin  . CARPAL TUNNEL RELEASE Right 06/08/2017   Procedure: CARPAL TUNNEL RELEASE ENDOSCOPIC;  Surgeon: Christena Flake, MD;  Location: [facility];  Service: Orthopedics;  Laterality: Right;  . CATARACT EXTRACTION W/PHACO Right 07/10/2019   Procedure: CATARACT EXTRACTION PHACO AND INTRAOCULAR LENS PLACEMENT (IOC) RIGHT DIABETIC  01:04.9  15.3%  9.97;  Surgeon: Galen Manila, MD;  Location: [facility];  Service: Ophthalmology;  Laterality: Right;  diabetic - insulin  . COLONOSCOPY      Prior to Admission medications   Medication Sig Start Date End Date Taking? Authorizing Provider  aspirin 81 MG tablet Take 81 mg by mouth daily.   Yes [provider]  atorvastatin (LIPITOR) 80 MG tablet Take 80 mg by mouth daily.   Yes [provider]  calcium-vitamin D (OSCAL-500) 500-400 MG-UNIT tablet Take by mouth. 08/12/15  Yes [provider]  Cholecalciferol (VITAMIN D) 2000 units tablet Take by mouth.   Yes [provider]  insulin NPH Human (HUMULIN N,NOVOLIN N) 100 UNIT/ML injection Inject into the skin. 30 units AM and PM   Yes [provider]  lisinopril (PRINIVIL,ZESTRIL) 10 MG tablet Take by mouth. 10/11/16 12/02/20 Yes [provider]  OVER THE COUNTER MEDICATION Focus Factor   Yes [provider]  Probiotic Product (PROBIOTIC PO) Take by mouth daily.   Yes [provider]  Semaglutide,0.25 or 0.5MG /DOS, (OZEMPIC, 0.25 OR 0.5 MG/DOSE,) 2 MG/1.5ML SOPN Inject 0.25 mg into the skin once a week.   Yes [provider]  Insulin Syringe-Needle U-100 (INSULIN SYRINGE .5CC/31GX5/16") 31G X 5/16" 0.5 ML MISC Use as directed 5 times daily. 06/07/16   [provider]    Allergies as of 10/31/2020 - Review Complete 07/10/2019  Allergen Reaction Noted  . Ancef [cefazolin] Nausea And Vomiting 04/20/2017  . Statins  07/11/2017    Family History  Problem Relation Age of Onset  . Cancer Sister   . Diabetes Sister   . Hypertension Sister   . Breast cancer Sister   . Deep vein thrombosis Brother   . Diabetes Brother   . Hypertension Brother     Social History   Socioeconomic History  . Marital status: Divorced    Spouse name: Not on file  . Number of children: Not on file  . Years of education: Not on file  . Highest education level: Not on file  Occupational History  . Not on file  Tobacco Use  . Smoking status: Never Smoker  . Smokeless tobacco: Never Used  Vaping Use  . Vaping Use: Never used  Substance and Sexual Activity  . Alcohol use: No  . Drug use: Never  . Sexual activity: Not on file  Other Topics Concern  .  Not on file  Social History Narrative  . Not on file   Social Determinants of Health   Financial Resource Strain: Not on file  Food Insecurity: Not on file  Transportation Needs: Not on file  Physical Activity: Not on file  Stress: Not on file  Social Connections: Not on file  Intimate Partner Violence: Not on file    Review of Systems: See HPI, otherwise negative ROS  Physical Exam: BP (!) 142/77   Pulse 89   Temp 97.6 F (36.4 C) (Temporal)   Resp 18   Ht 5' 3.5" (1.613 m)   Wt 72.1 kg   SpO2 97%   BMI 27.72 kg/m  General:    Alert,  pleasant and cooperative in NAD Head:  Normocephalic and atraumatic. Respiratory:  Normal work of breathing.  Impression/Plan: Lisa Fletcher is here for cataract surgery.  Risks, benefits, limitations, and alternatives regarding cataract surgery have been reviewed with the patient.  Questions have been answered.  All parties agreeable.   Galen Manila, MD  12/02/2020, 9:56 AM

## 2020-12-02 NOTE — Op Note (Signed)
PREOPERATIVE DIAGNOSIS:  Nuclear sclerotic cataract of the left eye.   POSTOPERATIVE DIAGNOSIS:  Nuclear sclerotic cataract of the left eye.   OPERATIVE PROCEDURE:@   SURGEON:  Galen Manila, MD.   ANESTHESIA:  Anesthesiologist: Page, Wille Celeste, MD CRNA: Maree Krabbe, CRNA  1.      Managed anesthesia care. 2.     0.23ml of Shugarcaine was instilled following the paracentesis   COMPLICATIONS:  None.   TECHNIQUE:   Stop and chop   DESCRIPTION OF PROCEDURE:  The patient was examined and consented in the preoperative holding area where the aforementioned topical anesthesia was applied to the left eye and then brought back to the Operating Room where the left eye was prepped and draped in the usual sterile ophthalmic fashion and a lid speculum was placed. A paracentesis was created with the side port blade and the anterior chamber was filled with viscoelastic. A near clear corneal incision was performed with the steel keratome. A continuous curvilinear capsulorrhexis was performed with a cystotome followed by the capsulorrhexis forceps. Hydrodissection and hydrodelineation were carried out with BSS on a blunt cannula. The lens was removed in a stop and chop  technique and the remaining cortical material was removed with the irrigation-aspiration handpiece. The capsular bag was inflated with viscoelastic and the Technis [id number] lens was placed in the capsular bag without complication. The remaining viscoelastic was removed from the eye with the irrigation-aspiration handpiece. The wounds were hydrated. The anterior chamber was flushed with BSS and the eye was inflated to physiologic pressure. 0.21ml Vigamox was placed in the anterior chamber. The wounds were found to be water tight. The eye was dressed with Combigan. The patient was given protective glasses to wear throughout the day and a shield with which to sleep tonight. The patient was also given drops with which to begin a drop regimen today and  will follow-up with me in one day. Implant Name Type Inv. Item Serial No. Manufacturer Lot No. LRB No. Used Action  LENS IOL TECNIS EYHANCE 22.0 - [id number] Intraocular Lens LENS IOL TECNIS EYHANCE 22.0 [id number] JOHNSON   Left 1 Implanted    Procedure(s) with comments: CATARACT EXTRACTION PHACO AND INTRAOCULAR LENS PLACEMENT (IOC) LEFT DIABETIC 4.85 00:39.9 (Left) - Diabetic - insulin  Electronically signed: Galen Manila 12/02/2020 10:21 AM

## 2020-12-02 NOTE — Anesthesia Postprocedure Evaluation (Signed)
Anesthesia Post Note  Patient: Lisa Fletcher  Procedure(s) Performed: CATARACT EXTRACTION PHACO AND INTRAOCULAR LENS PLACEMENT (IOC) LEFT DIABETIC 4.85 00:39.9 (Left Eye)     Patient location during evaluation: PACU Anesthesia Type: MAC Level of consciousness: awake and alert Pain management: pain level controlled Vital Signs Assessment: post-procedure vital signs reviewed and stable Respiratory status: spontaneous breathing Cardiovascular status: blood pressure returned to baseline Postop Assessment: no apparent nausea or vomiting, adequate PO intake and no headache Anesthetic complications: no   No complications documented.  Adele Barthel Beuford Garcilazo

## 2020-12-02 NOTE — Transfer of Care (Signed)
Immediate Anesthesia Transfer of Care Note  Patient: Lisa Fletcher  Procedure(s) Performed: CATARACT EXTRACTION PHACO AND INTRAOCULAR LENS PLACEMENT (IOC) LEFT DIABETIC 4.85 00:39.9 (Left Eye)  Patient Location: PACU  Anesthesia Type: MAC  Level of Consciousness: awake, alert  and patient cooperative  Airway and Oxygen Therapy: Patient Spontanous Breathing and Patient connected to supplemental oxygen  Post-op Assessment: Post-op Vital signs reviewed, Patient's Cardiovascular Status Stable, Respiratory Function Stable, Patent Airway and No signs of Nausea or vomiting  Post-op Vital Signs: Reviewed and stable  Complications: No complications documented.

## 2020-12-03 ENCOUNTER — Encounter: Payer: Self-pay | Admitting: Ophthalmology

## 2020-12-12 ENCOUNTER — Other Ambulatory Visit: Payer: Federal, State, Local not specified - PPO

## 2021-03-26 ENCOUNTER — Other Ambulatory Visit: Payer: Self-pay | Admitting: Internal Medicine

## 2021-03-26 DIAGNOSIS — Z1231 Encounter for screening mammogram for malignant neoplasm of breast: Secondary | ICD-10-CM

## 2021-05-04 ENCOUNTER — Other Ambulatory Visit: Payer: Self-pay

## 2021-05-04 ENCOUNTER — Ambulatory Visit
Admission: RE | Admit: 2021-05-04 | Discharge: 2021-05-04 | Disposition: A | Discharge status: Home / Self Care | Payer: Federal, State, Local not specified - PPO | Source: Ambulatory Visit | Attending: Internal Medicine | Admitting: Internal Medicine

## 2021-05-04 DIAGNOSIS — Z1231 Encounter for screening mammogram for malignant neoplasm of breast: Secondary | ICD-10-CM | POA: Diagnosis present

## 2022-01-29 IMAGING — MG MM DIGITAL SCREENING BILAT W/ TOMO AND CAD
8 series · 8 of 24 positions shown · non-contrast
Comparison: Previous exam(s).

CLINICAL DATA: Screening.

EXAM:
DIGITAL SCREENING BILATERAL MAMMOGRAM WITH TOMOSYNTHESIS AND CAD
TECHNIQUE: Bilateral screening digital craniocaudal and mediolateral oblique
mammograms were obtained. Bilateral screening digital breast
tomosynthesis was performed. The images were evaluated with
computer-aided detection.

[R CC synth-2D]
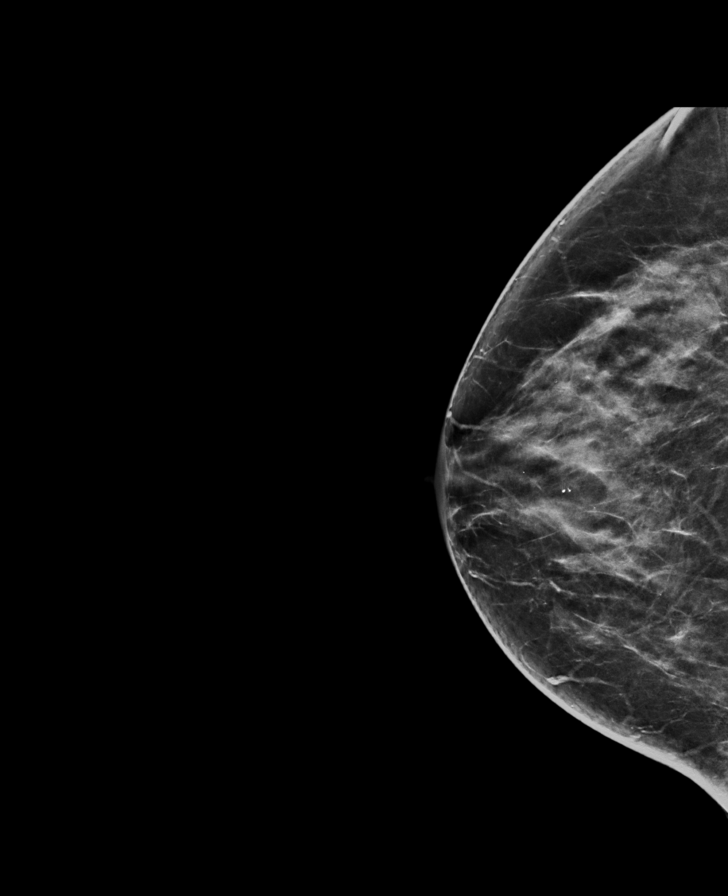

[L CC synth-2D]
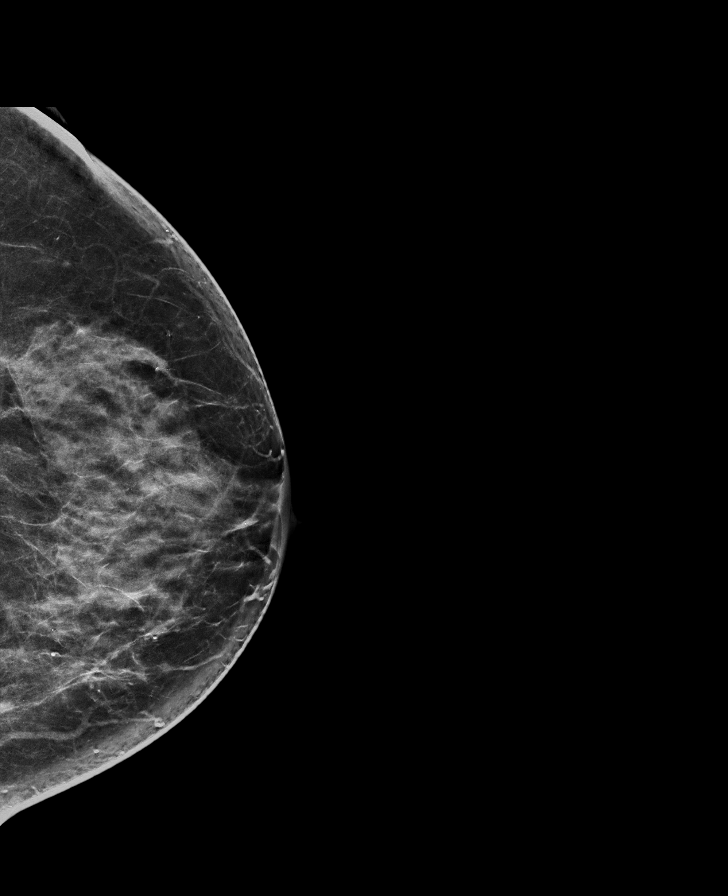

[R MLO synth-2D]
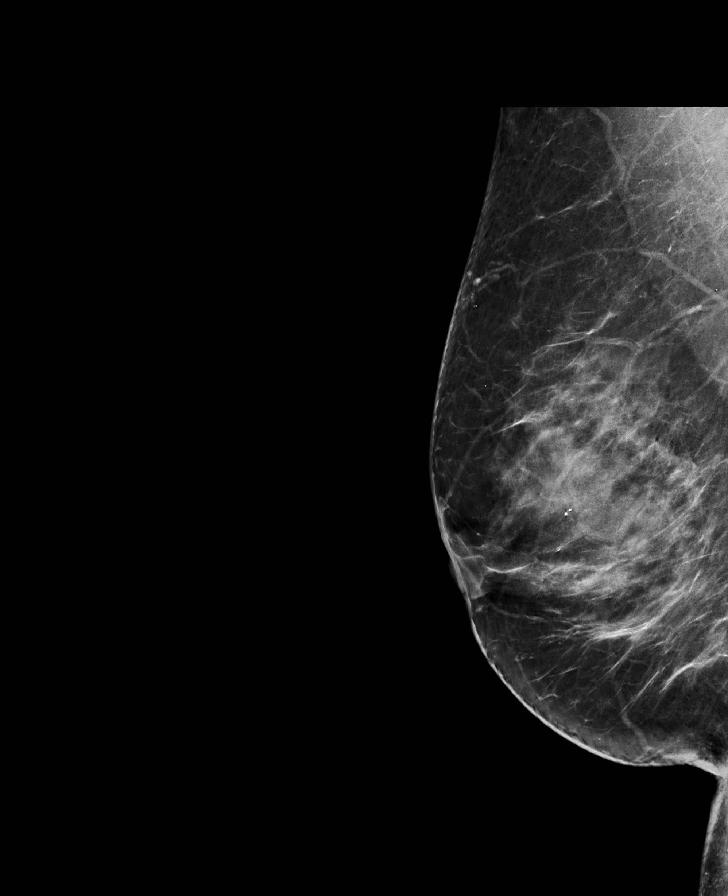

[L MLO synth-2D]
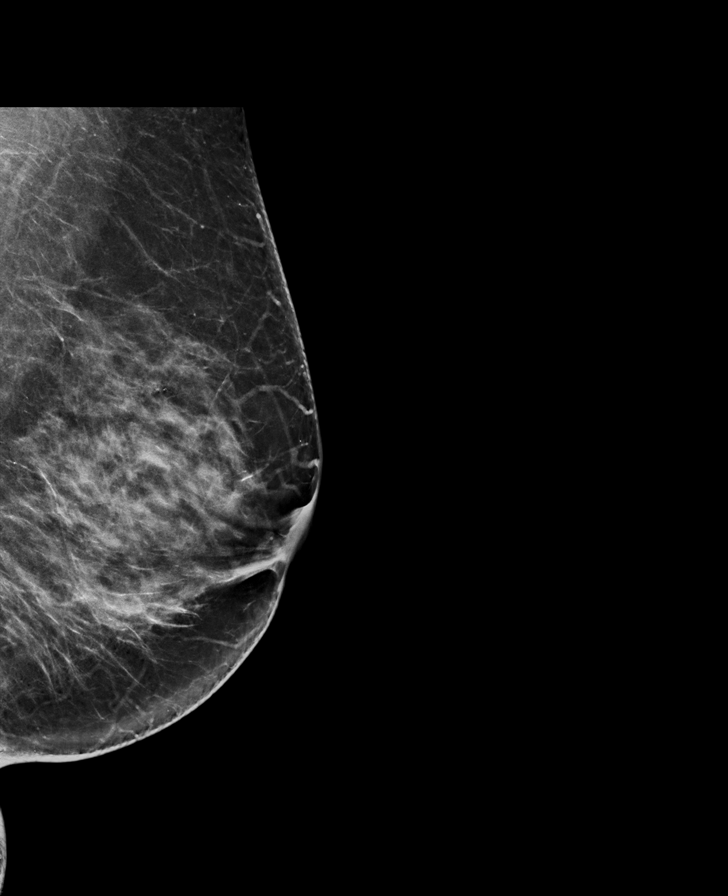

[L MLO tomo · tomo slice 39/78.0]
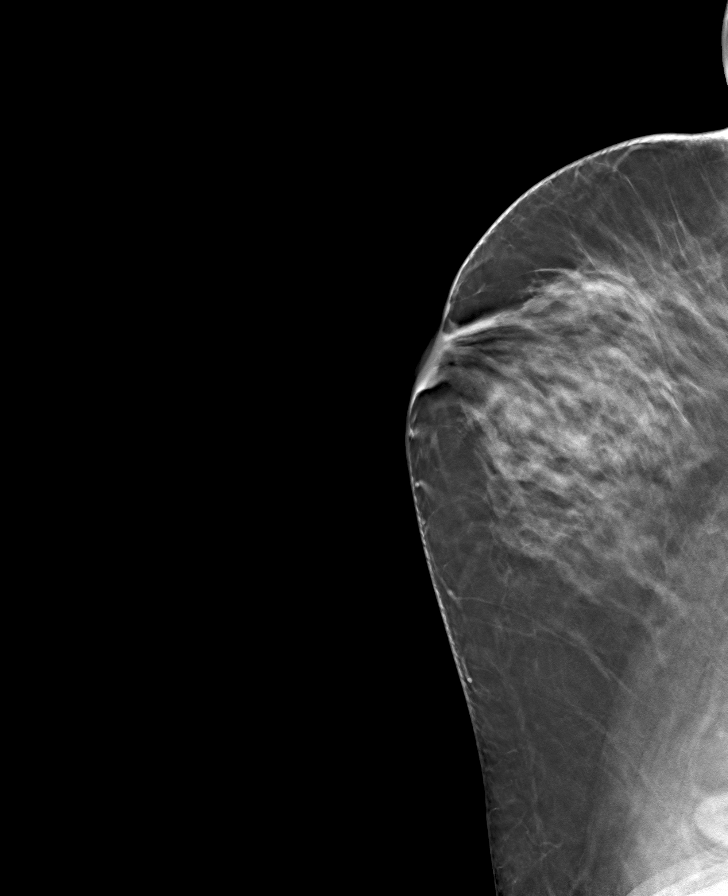

[L CC tomo · tomo slice 39/78.0]
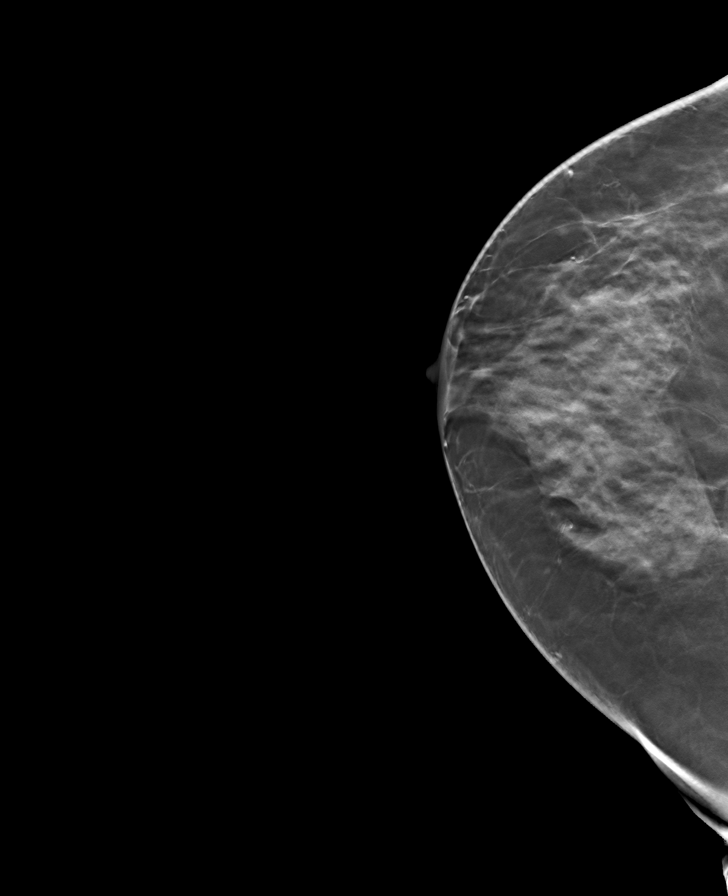

[R MLO tomo · tomo slice 42/83.0]
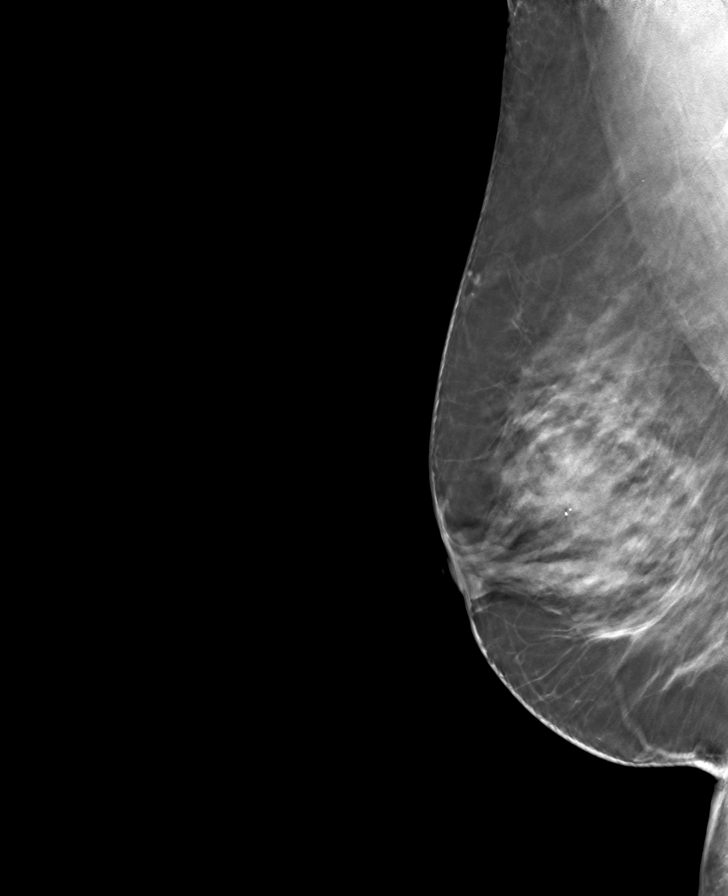

[R CC tomo · tomo slice 38/75.0]
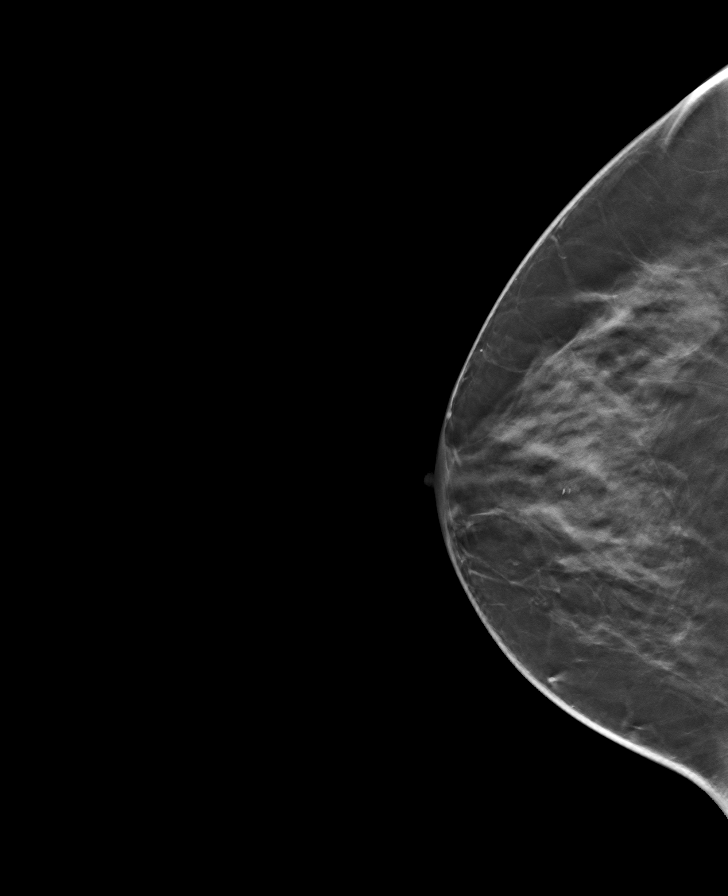

[8 of 24 positions shown; findings below may reference images not displayed]

ACR Breast Density Category c: The breast tissue is heterogeneously
dense, which may obscure small masses.
FINDINGS: There are no findings suspicious for malignancy.
IMPRESSION: No mammographic evidence of malignancy. A result letter of this
screening mammogram will be mailed directly to the patient.

RECOMMENDATION:
Screening mammogram in one year. (Code:Q3-W-BC3)

BI-RADS CATEGORY  1: Negative.

## 2022-06-14 ENCOUNTER — Other Ambulatory Visit: Payer: Self-pay | Admitting: Internal Medicine

## 2022-06-14 DIAGNOSIS — Z1231 Encounter for screening mammogram for malignant neoplasm of breast: Secondary | ICD-10-CM

## 2022-07-06 ENCOUNTER — Ambulatory Visit
Admission: RE | Admit: 2022-07-06 | Discharge: 2022-07-06 | Disposition: A | Discharge status: Home / Self Care | Payer: Federal, State, Local not specified - PPO | Source: Ambulatory Visit | Attending: Internal Medicine | Admitting: Internal Medicine

## 2022-07-06 DIAGNOSIS — Z1231 Encounter for screening mammogram for malignant neoplasm of breast: Secondary | ICD-10-CM | POA: Insufficient documentation

## 2022-09-27 HISTORY — PX: COLONOSCOPY: SHX174

## 2022-11-02 DIAGNOSIS — Z86010 Personal history of colonic polyps: Secondary | ICD-10-CM

## 2022-11-02 DIAGNOSIS — K640 First degree hemorrhoids: Secondary | ICD-10-CM | POA: Diagnosis not present

## 2023-06-06 ENCOUNTER — Other Ambulatory Visit: Payer: Self-pay | Admitting: Internal Medicine

## 2023-06-06 DIAGNOSIS — Z1231 Encounter for screening mammogram for malignant neoplasm of breast: Secondary | ICD-10-CM

## 2023-07-27 ENCOUNTER — Other Ambulatory Visit (INDEPENDENT_AMBULATORY_CARE_PROVIDER_SITE_OTHER): Payer: Self-pay

## 2023-07-27 ENCOUNTER — Ambulatory Visit: Payer: Federal, State, Local not specified - PPO | Admitting: Orthopaedic Surgery

## 2023-07-27 ENCOUNTER — Encounter: Payer: Self-pay | Admitting: Orthopaedic Surgery

## 2023-07-27 VITALS — Ht 63.0 in | Wt 159.0 lb

## 2023-07-27 DIAGNOSIS — M1712 Unilateral primary osteoarthritis, left knee: Secondary | ICD-10-CM

## 2023-07-27 DIAGNOSIS — M170 Bilateral primary osteoarthritis of knee: Secondary | ICD-10-CM

## 2023-07-27 DIAGNOSIS — M1711 Unilateral primary osteoarthritis, right knee: Secondary | ICD-10-CM | POA: Diagnosis not present

## 2023-07-27 NOTE — Progress Notes (Addendum)
Office Visit Note   Patient: Lisa Fletcher           Date of Birth: Jan 13, 1946           MRN: [id number] Visit Date: 07/27/2023              Requested by: Lauro Regulus, MD [address] [facility] [place],  [place] [address] PCP: Lauro Regulus, MD   Assessment & Plan: Visit Diagnoses:  1. Primary osteoarthritis of right knee   2. Primary osteoarthritis of left knee     Plan: Lisa Fletcher is a very pleasant 77 year old female with end-stage bilateral knee DJD worse on the right.  Based on her treatment options she has elected to move forward with a right total knee replacement in the near future hopefully towards the end of January.  Risk benefits prognosis of the surgery reviewed with the patient.  Eunice Blase will call the patient to confirm surgery date once we have the necessary clearances.  Impression is severe right knee degenerative joint disease secondary to Osteoarthritis.  Bone on bone joint space narrowing is seen on radiographs with mild valgus alignment.  At this point, conservative treatments fail to provide any significant relief and the pain is severely affecting ADLs and quality of life.  Based on treatment options, the patient has elected to move forward with a knee replacement.  We have discussed the surgical risks that include but are not limited to infection, DVT, leg length discrepancy, stiffness, numbness, tingling, incomplete relief of pain.  Recovery and prognosis were also reviewed.    Anticoagulants: aspirin 81 mg daily Postop anticoagulation: Aspirin 81 mg Diabetic: Yes  Nickel allergy: No Prior DVT/PE: No Tobacco use: No Clearances needed for surgery: Vella Kohler PCP Anticipated discharge dispo: Home   Follow-Up Instructions: No follow-ups on file.   Orders:  Orders Placed This Encounter  Procedures   XR KNEE 3 VIEW RIGHT   XR KNEE 3 VIEW LEFT   No orders of the defined types were placed in this encounter.      Procedures: No procedures performed   Clinical Data: No additional findings.   Subjective: Chief Complaint  Patient presents with   Right Knee - Pain   Left Knee - Pain    HPI Lisa Fletcher is a very pleasant 77 year old female here for evaluation of chronic bilateral knee pain that has become severe recently.  This affects ADLs significantly.  She had a cortisone injection in September at [facility] which have taken the edge off of the pain but the relief is not meaningful enough to her.  She is retired.  The pain affects her sleeping at times.  Review of Systems  Constitutional: Negative.   HENT: Negative.    Eyes: Negative.   Respiratory: Negative.    Cardiovascular: Negative.   Endocrine: Negative.   Musculoskeletal: Negative.   Neurological: Negative.   Hematological: Negative.   Psychiatric/Behavioral: Negative.    All other systems reviewed and are negative.    Objective: Vital Signs: Ht 5\' 3"  (1.6 m)   Wt 159 lb (72.1 kg)   BMI 28.17 kg/m   Physical Exam Vitals and nursing note reviewed.  Constitutional:      Appearance: She is well-developed.  HENT:     Head: Atraumatic.     Nose: Nose normal.  Eyes:     Extraocular Movements: Extraocular movements intact.  Cardiovascular:     Pulses: Normal pulses.  Pulmonary:  Effort: Pulmonary effort is normal.  Abdominal:     Palpations: Abdomen is soft.  Musculoskeletal:     Cervical back: Neck supple.  Skin:    General: Skin is warm.     Capillary Refill: Capillary refill takes less than 2 seconds.  Neurological:     Mental Status: She is alert. Mental status is at baseline.  Psychiatric:        Behavior: Behavior normal.        Thought Content: Thought content normal.        Judgment: Judgment normal.     Ortho Exam Exam of the right knee shows a mild flexion contracture and mild valgus deformity.  Flexion to 95 degrees with pain and crepitus.  No joint effusion.  Exam of the left knee  shows no flexion contracture.  Flexion to 95 degrees with pain and crepitus.  No joint effusion.  Collaterals and cruciates are stable. Specialty Comments:  No specialty comments available.  Imaging: XR KNEE 3 VIEW RIGHT  Result Date: 07/27/2023 X-rays demonstrate severe tricompartmental osteoarthritis.  Bone-on-bone joint space narrowing of the patellofemoral compartment with patella alta.  XR KNEE 3 VIEW LEFT  Result Date: 07/27/2023 X-rays of the left knee show advanced tricompartmental degenerative joint disease.  Bone-on-bone joint space narrowing of the patellofemoral compartment with patella alta.    PMFS History: Patient Active Problem List   Diagnosis Date Noted   Primary osteoarthritis of left knee 07/27/2023   Primary osteoarthritis of right knee 07/27/2023   Varicose veins of bilateral lower extremities with pain 12/23/2017   Uncontrolled type 2 diabetes mellitus with hyperglycemia, with long-term current use of insulin (HCC) 06/30/2016   DM II (diabetes mellitus, type II), controlled (HCC) 03/24/2015   Essential hypertension with goal blood pressure less than 140/90 03/24/2015   Pure hypercholesterolemia 03/24/2015   Past Medical History:  Diagnosis Date   Diabetes mellitus, type 2 (HCC)    Hyperlipidemia    Hypertension    Motion sickness    car(back seat) and circular motion   Wears dentures    partial upper    Family History  Problem Relation Age of Onset   Cancer Sister    Diabetes Sister    Hypertension Sister    Breast cancer Sister    Deep vein thrombosis Brother    Diabetes Brother    Hypertension Brother     Past Surgical History:  Procedure Laterality Date   ABDOMINAL HYSTERECTOMY     CARPAL TUNNEL RELEASE Left 04/20/2017   Procedure: CARPAL TUNNEL RELEASE ENDOSCOPIC;  Surgeon: Christena Flake, MD;  Location: [facility];  Service: Orthopedics;  Laterality: Left;  Diabetic - insulin   CARPAL TUNNEL RELEASE Right 06/08/2017   Procedure:  CARPAL TUNNEL RELEASE ENDOSCOPIC;  Surgeon: Christena Flake, MD;  Location: [facility];  Service: Orthopedics;  Laterality: Right;   CATARACT EXTRACTION W/PHACO Right 07/10/2019   Procedure: CATARACT EXTRACTION PHACO AND INTRAOCULAR LENS PLACEMENT (IOC) RIGHT DIABETIC  01:04.9  15.3%  9.97;  Surgeon: Galen Manila, MD;  Location: [facility];  Service: Ophthalmology;  Laterality: Right;  diabetic - insulin   CATARACT EXTRACTION W/PHACO Left 12/02/2020   Procedure: CATARACT EXTRACTION PHACO AND INTRAOCULAR LENS PLACEMENT (IOC) LEFT DIABETIC 4.85 00:39.9;  Surgeon: Galen Manila, MD;  Location: [facility];  Service: Ophthalmology;  Laterality: Left;  Diabetic - insulin   COLONOSCOPY     Social History   Occupational History   Not on file  Tobacco Use  Smoking status: Never   Smokeless tobacco: Never  Vaping Use   Vaping status: Never Used  Substance and Sexual Activity   Alcohol use: No   Drug use: Never   Sexual activity: Not on file

## 2023-09-27 ENCOUNTER — Ambulatory Visit
Admission: RE | Admit: 2023-09-27 | Discharge: 2023-09-27 | Disposition: A | Discharge status: Home / Self Care | Payer: Federal, State, Local not specified - PPO | Source: Ambulatory Visit | Attending: Internal Medicine | Admitting: Internal Medicine

## 2023-09-27 DIAGNOSIS — Z1231 Encounter for screening mammogram for malignant neoplasm of breast: Secondary | ICD-10-CM | POA: Diagnosis present

## 2023-10-10 ENCOUNTER — Telehealth: Payer: Self-pay | Admitting: Orthopaedic Surgery

## 2023-10-10 NOTE — Telephone Encounter (Signed)
 Any idea what paperwork she's referring to?

## 2023-10-10 NOTE — Telephone Encounter (Signed)
 No STD forms received.

## 2023-10-10 NOTE — Telephone Encounter (Signed)
 Patient's right total knee surgery  is scheduled for 11-14-23.  She has been cleared by her PCP.  Patient will have a pre-op appointment made much closer to her surgery date.  She will be given an arrival time at the pre-op appointment. Patient given the number to set up pre-op.  Patient is voicing concern about her post op care.  She states she lives alone.  She is asking for raised toilet seat and a walker and would like to know if there is anything else she will need.  I did tell her the hospital will assign a social worker or a case worker if she does have to go to a rehab facility but it may be better for her to find someone to stay with her in her home.  She states her son and daughter all work and have families.

## 2023-10-10 NOTE — Telephone Encounter (Signed)
 Patient states she have been waiting on the pre op paperwork and details about her upcoming surgery on 10/14/23.

## 2023-10-11 NOTE — Telephone Encounter (Signed)
 thx

## 2023-11-07 ENCOUNTER — Other Ambulatory Visit: Payer: Self-pay | Admitting: Physician Assistant

## 2023-11-07 MED ORDER — RIVAROXABAN 10 MG PO TABS: 10.0000 mg | ORAL_TABLET | Freq: Every day | ORAL | 0 refills | Status: DC

## 2023-11-07 MED ORDER — DOCUSATE SODIUM 100 MG PO CAPS: 100.0000 mg | ORAL_CAPSULE | Freq: Every day | ORAL | 2 refills | Status: AC | PRN

## 2023-11-07 MED ORDER — ONDANSETRON HCL 4 MG PO TABS: 4.0000 mg | ORAL_TABLET | Freq: Three times a day (TID) | ORAL | 0 refills | Status: DC | PRN

## 2023-11-07 MED ORDER — DOXYCYCLINE HYCLATE 100 MG PO CAPS
100.0000 mg | ORAL_CAPSULE | Freq: Two times a day (BID) | ORAL | 0 refills | Status: DC
Start: 2023-11-07 — End: 2024-02-01

## 2023-11-07 MED ORDER — OXYCODONE-ACETAMINOPHEN 5-325 MG PO TABS: 1.0000 | ORAL_TABLET | Freq: Three times a day (TID) | ORAL | 0 refills | Status: DC | PRN

## 2023-11-07 MED ORDER — METHOCARBAMOL 750 MG PO TABS: 750.0000 mg | ORAL_TABLET | Freq: Two times a day (BID) | ORAL | 2 refills | Status: DC | PRN

## 2023-11-08 ENCOUNTER — Encounter (HOSPITAL_COMMUNITY)
Admission: RE | Admit: 2023-11-08 | Discharge: 2023-11-08 | Disposition: A | Discharge status: Home / Self Care | Payer: Medicare Other | Source: Ambulatory Visit | Attending: Orthopaedic Surgery | Admitting: Orthopaedic Surgery

## 2023-11-08 ENCOUNTER — Encounter (HOSPITAL_COMMUNITY): Payer: Self-pay

## 2023-11-08 ENCOUNTER — Other Ambulatory Visit: Payer: Self-pay

## 2023-11-08 VITALS — BP 124/64 | HR 91 | Temp 98.0°F | Resp 17 | Ht 62.0 in | Wt 157.0 lb

## 2023-11-08 DIAGNOSIS — I119 Hypertensive heart disease without heart failure: Secondary | ICD-10-CM | POA: Insufficient documentation

## 2023-11-08 DIAGNOSIS — E119 Type 2 diabetes mellitus without complications: Secondary | ICD-10-CM

## 2023-11-08 DIAGNOSIS — I491 Atrial premature depolarization: Secondary | ICD-10-CM | POA: Insufficient documentation

## 2023-11-08 DIAGNOSIS — Z794 Long term (current) use of insulin: Secondary | ICD-10-CM | POA: Diagnosis not present

## 2023-11-08 DIAGNOSIS — Z01818 Encounter for other preprocedural examination: Secondary | ICD-10-CM | POA: Diagnosis present

## 2023-11-08 DIAGNOSIS — M1711 Unilateral primary osteoarthritis, right knee: Secondary | ICD-10-CM | POA: Diagnosis not present

## 2023-11-08 HISTORY — DX: Unspecified osteoarthritis, unspecified site: M19.90

## 2023-11-08 LAB — HEMOGLOBIN A1C
Hgb A1c MFr Bld: 6.7 % — ABNORMAL HIGH (ref 4.8–5.6)
Mean Plasma Glucose: 145.59 mg/dL

## 2023-11-08 LAB — CBC
HCT: 40.3 % (ref 36.0–46.0)
Hemoglobin: 13.1 g/dL (ref 12.0–15.0)
MCH: 29.6 pg (ref 26.0–34.0)
MCHC: 32.5 g/dL (ref 30.0–36.0)
MCV: 91.2 fL (ref 80.0–100.0)
Platelets: 301 10*3/uL (ref 150–400)
RBC: 4.42 MIL/uL (ref 3.87–5.11)
RDW: 13.7 % (ref 11.5–15.5)
WBC: 8.6 10*3/uL (ref 4.0–10.5)
nRBC: 0 % (ref 0.0–0.2)

## 2023-11-08 LAB — SURGICAL PCR SCREEN
MRSA, PCR: NEGATIVE
Staphylococcus aureus: NEGATIVE

## 2023-11-08 LAB — BASIC METABOLIC PANEL
Anion gap: 8 (ref 5–15)
BUN: 19 mg/dL (ref 8–23)
CO2: 24 mmol/L (ref 22–32)
Calcium: 8.9 mg/dL (ref 8.9–10.3)
Chloride: 109 mmol/L (ref 98–111)
Creatinine, Ser: 1 mg/dL (ref 0.44–1.00)
GFR, Estimated: 58 mL/min — ABNORMAL LOW (ref >60)
Glucose, Bld: 171 mg/dL — ABNORMAL HIGH (ref 70–99)
Potassium: 3.8 mmol/L (ref 3.5–5.1)
Sodium: 141 mmol/L (ref 135–145)

## 2023-11-08 LAB — GLUCOSE, CAPILLARY: Glucose-Capillary: 187 mg/dL — ABNORMAL HIGH (ref 70–99)

## 2023-11-08 NOTE — Progress Notes (Signed)
PCP - Dr. Einar Crow Cardiologist - Dr. Marcina Millard - Last office visit 09/07/2022 with PRN follow-up  PPM/ICD - Denies Device Orders - n/a Rep Notified - n/a  Chest x-ray - n/a EKG - 11/08/2023 Stress Test - 10/31/2020 (CE) ECHO - 10/31/2020 (CE) Cardiac Cath - Denies  Sleep Study - Denies CPAP - n/a  Pt is DM2. She checks her blood sugar 2-3x/day. Normal fasting range is 90-120. CBG at pre-op 187. A1c result pending  Last dose of GLP1 agonist- Last dose of Ozempic was February 2nd. GLP1 instructions: Pt instructed to not take anymore doses until after surgery  Blood Thinner Instructions: n/a Aspirin Instructions: Pts last dose of ASA was February 10th.  ERAS Protcol - Clear liquids until 0815 morning of surgery PRE-SURGERY Ensure or G2- G2 given to pt with instructions  COVID TEST- n/a   Anesthesia review: Yes. EKG review. Discussed with Terance Hart, PA-C. Pt denies any CP/SOB and she can climb at least two flights of stairs without any difficulty other than her knee pain.  Patient denies shortness of breath, fever, cough and chest pain at PAT appointment. Pt endorses non-productive cough, sneezing and fatigue that lasted one week starting February 1st. She had had resolution of symptoms since February 9th.   All instructions explained to the patient, with a verbal understanding of the material. Patient agrees to go over the instructions while at home for a better understanding. Patient also instructed to self quarantine after being tested for COVID-19. The opportunity to ask questions was provided.

## 2023-11-09 ENCOUNTER — Encounter (HOSPITAL_COMMUNITY): Payer: Self-pay

## 2023-11-09 NOTE — Progress Notes (Signed)
Case: [id number] Date/Time: 11/14/23 1100   Procedure: RIGHT TOTAL KNEE ARTHROPLASTY (Right: Knee)   Anesthesia type: Spinal   Pre-op diagnosis: right hip osteoarthritis   Location: [facility] ROOM 05 / [facility]   Surgeons: Tarry Kos, MD       DISCUSSION: Lisa Fletcher is a 78 yo female who presents to PAT prior to surgery above. PMH of HTN, IDDM, arthritis.   Patient with hx of abnormal EKG with diffuse T wave inversions. EKG obtained at PAT visit overall appears very similar to EKG in 2018. Patient has seen Cardiology in the past and had ischemic evaluation for abnormal EKG with DOE. Last seen at [facility] on 09/07/2022. Per Dr. Darrold Junker: "The patient underwent stress echocardiogram on 2020/11/17, exercised 7 minutes and 8 seconds on a Bruce protocol without chest pain or ischemic ECG changes. At baseline, 2D echocardiogram revealed normal left ventricular function, with LVEF greater than 55%. At peak exercise there was appropriate augmentation of all myocardial segments without evidence for scar or ischemia." Advised to f/u on as needed basis.  Reviewed EKG with Dr Eduard Clos. Ok to proceed if pt is not endorsing any new symptoms  Follows with PCP. Last seen on 09/05/23. Per Dr. Dareen Piano at that visit: "Generally controlled with severe right knee pain noted, tka being planned/considered. Stable for surgery, no contraindications. Will need someone with her post op. Holding ozempic a week prior discussed and written down."   VS: BP 124/64   Pulse 91   Temp 36.7 C   Resp 17   Ht 5\' 2"  (1.575 m)   Wt 71.2 kg   SpO2 99%   BMI 28.72 kg/m   PROVIDERS: Lauro Regulus, MD   LABS: Labs reviewed: Acceptable for surgery. (all labs ordered are listed, but only abnormal results are displayed)  Labs Reviewed  GLUCOSE, CAPILLARY - Abnormal; Notable for the following components:      Result Value   Glucose-Capillary 187 (*)    All other components within normal limits  BASIC METABOLIC PANEL -  Abnormal; Notable for the following components:   Glucose, Bld 171 (*)    GFR, Estimated 58 (*)    All other components within normal limits  HEMOGLOBIN A1C - Abnormal; Notable for the following components:   Hgb A1c MFr Bld 6.7 (*)    All other components within normal limits  SURGICAL PCR SCREEN  CBC     IMAGES:   EKG 11/08/23:  Sinus rhythm with Premature atrial complexes, rate 73 Possible Left atrial enlargement ST & T wave abnormality, consider inferior ischemia ST & T wave abnormality, consider anterolateral ischemia Abnormal ECG When compared with ECG of 18-Apr-2017 14:38, HEART RATE has decreased Inferior ST/T changes are more pronounced  CV:  Echo 11-17-2020:  INTERPRETATION Normal Stress Echocardiogram   WITH MILD LVH NORMAL RIGHT VENTRICULAR SYSTOLIC FUNCTION TRIVIAL REGURGITATION NOTED (See above) NO VALVULAR STENOSIS NOTED  Past Medical History:  Diagnosis Date   Arthritis    Diabetes mellitus, type 2 (HCC)    Hyperlipidemia    Hypertension    Motion sickness    car(back seat) and circular motion   Wears dentures    partial upper    Past Surgical History:  Procedure Laterality Date   ABDOMINAL HYSTERECTOMY     1970s   CARPAL TUNNEL RELEASE Left 04/20/2017   Procedure: CARPAL TUNNEL RELEASE ENDOSCOPIC;  Surgeon: Christena Flake, MD;  Location: [facility];  Service: Orthopedics;  Laterality: Left;  Diabetic -  insulin   CARPAL TUNNEL RELEASE Right 06/08/2017   Procedure: CARPAL TUNNEL RELEASE ENDOSCOPIC;  Surgeon: Christena Flake, MD;  Location: [facility];  Service: Orthopedics;  Laterality: Right;   CATARACT EXTRACTION W/PHACO Right 07/10/2019   Procedure: CATARACT EXTRACTION PHACO AND INTRAOCULAR LENS PLACEMENT (IOC) RIGHT DIABETIC  01:04.9  15.3%  9.97;  Surgeon: Galen Manila, MD;  Location: [facility];  Service: Ophthalmology;  Laterality: Right;  diabetic - insulin   CATARACT EXTRACTION W/PHACO Left 12/02/2020    Procedure: CATARACT EXTRACTION PHACO AND INTRAOCULAR LENS PLACEMENT (IOC) LEFT DIABETIC 4.85 00:39.9;  Surgeon: Galen Manila, MD;  Location: [facility];  Service: Ophthalmology;  Laterality: Left;  Diabetic - insulin   COLONOSCOPY  2024    MEDICATIONS:  docusate sodium (COLACE) 100 MG capsule   doxycycline (VIBRAMYCIN) 100 MG capsule   methocarbamol (ROBAXIN-750) 750 MG tablet   ondansetron (ZOFRAN) 4 MG tablet   oxyCODONE-acetaminophen (PERCOCET) 5-325 MG tablet   rivaroxaban (XARELTO) 10 MG TABS tablet   Ascorbic Acid (VITAMIN C PO)   aspirin 81 MG tablet   Cholecalciferol (VITAMIN D) 2000 units tablet   GINKGO BILOBA PO   insulin NPH Human (HUMULIN N,NOVOLIN N) 100 UNIT/ML injection   Insulin Syringe-Needle U-100 (INSULIN SYRINGE .5CC/31GX5/16") 31G X 5/16" 0.5 ML MISC   lisinopril (PRINIVIL,ZESTRIL) 10 MG tablet   Multiple Vitamin (MULTIVITAMIN WITH MINERALS) TABS tablet   OZEMPIC, 1 MG/DOSE, 4 MG/3ML SOPN   rosuvastatin (CRESTOR) 20 MG tablet   traZODone (DESYREL) 50 MG tablet   No current facility-administered medications for this encounter.   [name] [facility] Surgical Short Stay/Anesthesiology [facility] Phone [phone] 11/09/2023 3:36 PM

## 2023-11-09 NOTE — Anesthesia Preprocedure Evaluation (Addendum)
 Anesthesia Evaluation  Patient identified by MRN, date of birth, ID band Patient awake    Reviewed: Allergy & Precautions, NPO status , Patient's Chart, lab work & pertinent test results  History of Anesthesia Complications Negative for: history of anesthetic complications  Airway Mallampati: II  TM Distance: >3 FB Neck ROM: Full    Dental  (+) Dental Advisory Given, Missing   Pulmonary neg pulmonary ROS   breath sounds clear to auscultation       Cardiovascular hypertension, Pt. on medications (-) angina  Rhythm:Regular Rate:Normal  10/31/2020 stress ECHO: exercised 7 minutes and 8 seconds on a Bruce protocol without chest pain or ischemic ECG changes. At baseline, 2D echocardiogram revealed normal left ventricular function, with LVEF greater than 55%. At peak exercise there was appropriate augmentation of all myocardial segments without evidence for scar or ischemia.   Neuro/Psych negative neurological ROS     GI/Hepatic negative GI ROS, Neg liver ROS,,,  Endo/Other  diabetes (glu 122), Insulin Dependent  Ozempic: last 11/06/2023  Renal/GU negative Renal ROS     Musculoskeletal  (+) Arthritis ,    Abdominal   Peds  Hematology xarelto   Anesthesia Other Findings   Reproductive/Obstetrics                             Anesthesia Physical Anesthesia Plan  ASA: 3  Anesthesia Plan: Spinal   Post-op Pain Management: Regional block* and Tylenol PO (pre-op)*   Induction:   PONV Risk Score and Plan: 2 and Treatment may vary due to age or medical condition and Ondansetron  Airway Management Planned: Natural Airway and Simple Face Mask  Additional Equipment: None  Intra-op Plan:   Post-operative Plan:   Informed Consent: I have reviewed the patients History and Physical, chart, labs and discussed the procedure including the risks, benefits and alternatives for the proposed anesthesia with the  patient or authorized representative who has indicated his/her understanding and acceptance.     Dental advisory given  Plan Discussed with: CRNA and Surgeon  Anesthesia Plan Comments: (See PAT note from 2/11 by K Gekas PA-C Plan routine monitors, SAB with adductor canal block for post op analgesia)        Anesthesia Quick Evaluation

## 2023-11-11 MED ORDER — TRANEXAMIC ACID 1000 MG/10ML IV SOLN
2000.0000 mg | INTRAVENOUS | Status: DC
  Filled 2023-11-11: qty 20

## 2023-11-14 ENCOUNTER — Encounter (HOSPITAL_COMMUNITY): Payer: Self-pay | Admitting: Orthopaedic Surgery

## 2023-11-14 ENCOUNTER — Ambulatory Visit (HOSPITAL_COMMUNITY): Payer: Medicare Other | Admitting: Vascular Surgery

## 2023-11-14 ENCOUNTER — Encounter (HOSPITAL_COMMUNITY)
Admission: RE | Disposition: A | Discharge status: Home / Self Care | Payer: Self-pay | Source: Ambulatory Visit | Attending: Orthopaedic Surgery

## 2023-11-14 ENCOUNTER — Other Ambulatory Visit: Payer: Self-pay

## 2023-11-14 ENCOUNTER — Observation Stay (HOSPITAL_COMMUNITY): Payer: Medicare Other

## 2023-11-14 ENCOUNTER — Observation Stay (HOSPITAL_COMMUNITY)
Admission: RE | Admit: 2023-11-14 | Discharge: 2023-11-15 | Disposition: A | Discharge status: Home / Self Care | Payer: Medicare Other | Source: Ambulatory Visit | Attending: Orthopaedic Surgery | Admitting: Orthopaedic Surgery

## 2023-11-14 ENCOUNTER — Ambulatory Visit (HOSPITAL_COMMUNITY): Payer: Medicare Other | Admitting: Anesthesiology

## 2023-11-14 DIAGNOSIS — Z794 Long term (current) use of insulin: Secondary | ICD-10-CM | POA: Diagnosis not present

## 2023-11-14 DIAGNOSIS — M1711 Unilateral primary osteoarthritis, right knee: Principal | ICD-10-CM | POA: Insufficient documentation

## 2023-11-14 DIAGNOSIS — E119 Type 2 diabetes mellitus without complications: Secondary | ICD-10-CM

## 2023-11-14 DIAGNOSIS — I10 Essential (primary) hypertension: Secondary | ICD-10-CM

## 2023-11-14 DIAGNOSIS — Z79899 Other long term (current) drug therapy: Secondary | ICD-10-CM | POA: Insufficient documentation

## 2023-11-14 DIAGNOSIS — M1712 Unilateral primary osteoarthritis, left knee: Secondary | ICD-10-CM

## 2023-11-14 DIAGNOSIS — Z7982 Long term (current) use of aspirin: Secondary | ICD-10-CM | POA: Insufficient documentation

## 2023-11-14 DIAGNOSIS — Z96651 Presence of right artificial knee joint: Secondary | ICD-10-CM

## 2023-11-14 DIAGNOSIS — Z7901 Long term (current) use of anticoagulants: Secondary | ICD-10-CM | POA: Insufficient documentation

## 2023-11-14 HISTORY — PX: TOTAL KNEE ARTHROPLASTY: SHX125

## 2023-11-14 LAB — GLUCOSE, CAPILLARY
Glucose-Capillary: 122 mg/dL — ABNORMAL HIGH (ref 70–99)
Glucose-Capillary: 116 mg/dL — ABNORMAL HIGH (ref 70–99)
Glucose-Capillary: 83 mg/dL (ref 70–99)
Glucose-Capillary: 237 mg/dL — ABNORMAL HIGH (ref 70–99)

## 2023-11-14 SURGERY — ARTHROPLASTY, KNEE, TOTAL
Anesthesia: Spinal | Site: Knee | Laterality: Right

## 2023-11-14 MED ORDER — PHENYLEPHRINE HCL-NACL 20-0.9 MG/250ML-% IV SOLN
INTRAVENOUS | Status: DC | PRN
  Administered 2023-11-14: 40 ug/min via INTRAVENOUS

## 2023-11-14 MED ORDER — BUPIVACAINE IN DEXTROSE 0.75-8.25 % IT SOLN
INTRATHECAL | Status: DC | PRN
  Administered 2023-11-14: 12 mg via INTRATHECAL

## 2023-11-14 MED ORDER — 0.9 % SODIUM CHLORIDE (POUR BTL) OPTIME
TOPICAL | Status: DC | PRN
  Administered 2023-11-14: 1000 mL

## 2023-11-14 MED ORDER — LACTATED RINGERS IV SOLN: INTRAVENOUS | Status: DC

## 2023-11-14 MED ORDER — VANCOMYCIN HCL 1000 MG IV SOLR
INTRAVENOUS | Status: AC
  Filled 2023-11-14: qty 20

## 2023-11-14 MED ORDER — BUPIVACAINE-MELOXICAM ER 400-12 MG/14ML IJ SOLN
INTRAMUSCULAR | Status: DC | PRN
  Administered 2023-11-14: 400 mg

## 2023-11-14 MED ORDER — ACETAMINOPHEN 500 MG PO TABS
1000.0000 mg | ORAL_TABLET | Freq: Four times a day (QID) | ORAL | Status: DC
  Administered 2023-11-14 – 2023-11-15 (×3): 1000 mg via ORAL
  Filled 2023-11-14 (×3): qty 2

## 2023-11-14 MED ORDER — HYDROMORPHONE HCL 1 MG/ML IJ SOLN: 0.5000 mg | INTRAMUSCULAR | Status: DC | PRN

## 2023-11-14 MED ORDER — CHLORHEXIDINE GLUCONATE 0.12 % MT SOLN
15.0000 mL | Freq: Once | OROMUCOSAL | Status: AC
  Administered 2023-11-14: 15 mL via OROMUCOSAL
  Filled 2023-11-14: qty 15

## 2023-11-14 MED ORDER — TRANEXAMIC ACID 1000 MG/10ML IV SOLN
INTRAVENOUS | Status: DC | PRN
  Administered 2023-11-14: 2000 mg via TOPICAL

## 2023-11-14 MED ORDER — MIDAZOLAM HCL 2 MG/2ML IJ SOLN: 0.5000 mg | Freq: Once | INTRAMUSCULAR | Status: DC | PRN

## 2023-11-14 MED ORDER — ORAL CARE MOUTH RINSE: 15.0000 mL | Freq: Once | OROMUCOSAL | Status: AC

## 2023-11-14 MED ORDER — OXYCODONE HCL 5 MG PO TABS: 5.0000 mg | ORAL_TABLET | Freq: Once | ORAL | Status: DC | PRN

## 2023-11-14 MED ORDER — METOCLOPRAMIDE HCL 5 MG PO TABS: 5.0000 mg | ORAL_TABLET | Freq: Three times a day (TID) | ORAL | Status: DC | PRN

## 2023-11-14 MED ORDER — PRONTOSAN WOUND IRRIGATION OPTIME
TOPICAL | Status: DC | PRN
  Administered 2023-11-14: 1 via TOPICAL

## 2023-11-14 MED ORDER — MIDAZOLAM HCL 2 MG/2ML IJ SOLN: 1.0000 mg | Freq: Once | INTRAMUSCULAR | Status: AC

## 2023-11-14 MED ORDER — PHENYLEPHRINE HCL (PRESSORS) 10 MG/ML IV SOLN
INTRAVENOUS | Status: DC | PRN
  Administered 2023-11-14: 120 ug via INTRAVENOUS

## 2023-11-14 MED ORDER — ACETAMINOPHEN 500 MG PO TABS
1000.0000 mg | ORAL_TABLET | Freq: Once | ORAL | Status: AC
  Administered 2023-11-14: 1000 mg via ORAL
  Filled 2023-11-14: qty 2

## 2023-11-14 MED ORDER — ONDANSETRON HCL 4 MG/2ML IJ SOLN
INTRAMUSCULAR | Status: AC
  Filled 2023-11-14: qty 2

## 2023-11-14 MED ORDER — HYDROMORPHONE HCL 1 MG/ML IJ SOLN: 0.2500 mg | INTRAMUSCULAR | Status: DC | PRN

## 2023-11-14 MED ORDER — DOXYCYCLINE HYCLATE 50 MG PO CAPS
100.0000 mg | ORAL_CAPSULE | Freq: Two times a day (BID) | ORAL | Status: DC
  Administered 2023-11-14 – 2023-11-15 (×2): 100 mg via ORAL
  Filled 2023-11-14 (×2): qty 2

## 2023-11-14 MED ORDER — POVIDONE-IODINE 10 % EX SWAB
2.0000 | Freq: Once | CUTANEOUS | Status: AC
  Administered 2023-11-14: 2 via TOPICAL

## 2023-11-14 MED ORDER — LISINOPRIL 10 MG PO TABS
10.0000 mg | ORAL_TABLET | Freq: Every day | ORAL | Status: DC
  Administered 2023-11-14: 10 mg via ORAL
  Filled 2023-11-14 (×2): qty 1

## 2023-11-14 MED ORDER — PROPOFOL 500 MG/50ML IV EMUL
INTRAVENOUS | Status: DC | PRN
  Administered 2023-11-14: 65 ug/kg/min via INTRAVENOUS

## 2023-11-14 MED ORDER — FENTANYL CITRATE (PF) 100 MCG/2ML IJ SOLN
INTRAMUSCULAR | Status: AC
  Administered 2023-11-14: 50 ug via INTRAVENOUS
  Filled 2023-11-14: qty 2

## 2023-11-14 MED ORDER — LEVOFLOXACIN IN D5W 500 MG/100ML IV SOLN
500.0000 mg | INTRAVENOUS | Status: AC
  Administered 2023-11-14: 500 mg via INTRAVENOUS
  Filled 2023-11-14: qty 100

## 2023-11-14 MED ORDER — DOCUSATE SODIUM 100 MG PO CAPS
100.0000 mg | ORAL_CAPSULE | Freq: Two times a day (BID) | ORAL | Status: DC
  Administered 2023-11-14 – 2023-11-15 (×2): 100 mg via ORAL
  Filled 2023-11-14 (×2): qty 1

## 2023-11-14 MED ORDER — DEXAMETHASONE SODIUM PHOSPHATE 10 MG/ML IJ SOLN
INTRAMUSCULAR | Status: AC
  Filled 2023-11-14: qty 1

## 2023-11-14 MED ORDER — PHENOL 1.4 % MT LIQD: 1.0000 | OROMUCOSAL | Status: DC | PRN

## 2023-11-14 MED ORDER — ROPIVACAINE HCL 7.5 MG/ML IJ SOLN
INTRAMUSCULAR | Status: DC | PRN
Start: 2023-11-14 — End: 2023-11-14
  Administered 2023-11-14: 20 mL via PERINEURAL

## 2023-11-14 MED ORDER — PHENYLEPHRINE 80 MCG/ML (10ML) SYRINGE FOR IV PUSH (FOR BLOOD PRESSURE SUPPORT)
PREFILLED_SYRINGE | INTRAVENOUS | Status: AC
  Filled 2023-11-14: qty 10

## 2023-11-14 MED ORDER — SODIUM CHLORIDE 0.9 % IR SOLN
Status: DC | PRN
  Administered 2023-11-14: 1000 mL

## 2023-11-14 MED ORDER — OXYCODONE HCL 5 MG/5ML PO SOLN: 5.0000 mg | Freq: Once | ORAL | Status: DC | PRN

## 2023-11-14 MED ORDER — FERROUS SULFATE 325 (65 FE) MG PO TABS
325.0000 mg | ORAL_TABLET | Freq: Three times a day (TID) | ORAL | Status: DC
  Administered 2023-11-14 – 2023-11-15 (×2): 325 mg via ORAL
  Filled 2023-11-14 (×2): qty 1

## 2023-11-14 MED ORDER — FENTANYL CITRATE (PF) 100 MCG/2ML IJ SOLN: 50.0000 ug | Freq: Once | INTRAMUSCULAR | Status: AC

## 2023-11-14 MED ORDER — OXYCODONE HCL ER 10 MG PO T12A
10.0000 mg | EXTENDED_RELEASE_TABLET | Freq: Two times a day (BID) | ORAL | Status: DC
  Administered 2023-11-14: 10 mg via ORAL
  Filled 2023-11-14: qty 1

## 2023-11-14 MED ORDER — ONDANSETRON HCL 4 MG/2ML IJ SOLN
INTRAMUSCULAR | Status: DC | PRN
  Administered 2023-11-14: 4 mg via INTRAVENOUS

## 2023-11-14 MED ORDER — TRANEXAMIC ACID-NACL 1000-0.7 MG/100ML-% IV SOLN
1000.0000 mg | Freq: Once | INTRAVENOUS | Status: AC
  Administered 2023-11-14: 1000 mg via INTRAVENOUS
  Filled 2023-11-14: qty 100

## 2023-11-14 MED ORDER — INSULIN ASPART 100 UNIT/ML IJ SOLN: 0.0000 [IU] | Freq: Three times a day (TID) | INTRAMUSCULAR | Status: DC

## 2023-11-14 MED ORDER — ACETAMINOPHEN 325 MG PO TABS: 325.0000 mg | ORAL_TABLET | Freq: Four times a day (QID) | ORAL | Status: DC | PRN

## 2023-11-14 MED ORDER — DEXAMETHASONE SODIUM PHOSPHATE 10 MG/ML IJ SOLN
10.0000 mg | Freq: Once | INTRAMUSCULAR | Status: AC
  Administered 2023-11-15: 10 mg via INTRAVENOUS
  Filled 2023-11-14: qty 1

## 2023-11-14 MED ORDER — DEXAMETHASONE SODIUM PHOSPHATE 10 MG/ML IJ SOLN
INTRAMUSCULAR | Status: DC | PRN
  Administered 2023-11-14: 5 mg via INTRAVENOUS

## 2023-11-14 MED ORDER — VANCOMYCIN HCL IN DEXTROSE 1-5 GM/200ML-% IV SOLN
1000.0000 mg | INTRAVENOUS | Status: AC
  Administered 2023-11-14: 1000 mg via INTRAVENOUS
  Filled 2023-11-14: qty 200

## 2023-11-14 MED ORDER — ONDANSETRON HCL 4 MG/2ML IJ SOLN
4.0000 mg | Freq: Four times a day (QID) | INTRAMUSCULAR | Status: DC | PRN
  Administered 2023-11-15: 4 mg via INTRAVENOUS
  Filled 2023-11-14: qty 2

## 2023-11-14 MED ORDER — INSULIN ASPART 100 UNIT/ML IJ SOLN
0.0000 [IU] | Freq: Every day | INTRAMUSCULAR | Status: DC
  Administered 2023-11-14: 2 [IU] via SUBCUTANEOUS

## 2023-11-14 MED ORDER — FENTANYL CITRATE (PF) 250 MCG/5ML IJ SOLN
INTRAMUSCULAR | Status: AC
  Filled 2023-11-14: qty 5

## 2023-11-14 MED ORDER — OXYCODONE HCL 5 MG PO TABS
5.0000 mg | ORAL_TABLET | ORAL | Status: DC | PRN
  Administered 2023-11-14 – 2023-11-15 (×2): 5 mg via ORAL
  Filled 2023-11-14: qty 1

## 2023-11-14 MED ORDER — RIVAROXABAN 10 MG PO TABS
10.0000 mg | ORAL_TABLET | Freq: Every day | ORAL | Status: DC
  Administered 2023-11-15: 10 mg via ORAL
  Filled 2023-11-14: qty 1

## 2023-11-14 MED ORDER — VANCOMYCIN HCL 1000 MG IV SOLR
INTRAVENOUS | Status: DC | PRN
  Administered 2023-11-14: 1000 mg

## 2023-11-14 MED ORDER — MENTHOL 3 MG MT LOZG: 1.0000 | LOZENGE | OROMUCOSAL | Status: DC | PRN

## 2023-11-14 MED ORDER — TRAZODONE HCL 50 MG PO TABS
50.0000 mg | ORAL_TABLET | Freq: Every day | ORAL | Status: DC
  Administered 2023-11-14: 50 mg via ORAL
  Filled 2023-11-14: qty 1

## 2023-11-14 MED ORDER — INSULIN NPH (HUMAN) (ISOPHANE) 100 UNIT/ML ~~LOC~~ SUSP
30.0000 [IU] | Freq: Two times a day (BID) | SUBCUTANEOUS | Status: DC
Start: 2023-11-14 — End: 2023-11-15
  Administered 2023-11-14 – 2023-11-15 (×2): 30 [IU] via SUBCUTANEOUS
  Filled 2023-11-14: qty 10

## 2023-11-14 MED ORDER — PROPOFOL 10 MG/ML IV BOLUS
INTRAVENOUS | Status: AC
  Filled 2023-11-14: qty 20

## 2023-11-14 MED ORDER — METHOCARBAMOL 500 MG PO TABS
500.0000 mg | ORAL_TABLET | Freq: Four times a day (QID) | ORAL | Status: DC | PRN
  Administered 2023-11-14: 500 mg via ORAL
  Filled 2023-11-14 (×2): qty 1

## 2023-11-14 MED ORDER — MIDAZOLAM HCL 2 MG/2ML IJ SOLN
INTRAMUSCULAR | Status: AC
  Administered 2023-11-14: 1 mg via INTRAVENOUS
  Filled 2023-11-14: qty 2

## 2023-11-14 MED ORDER — METHOCARBAMOL 1000 MG/10ML IJ SOLN: 500.0000 mg | Freq: Four times a day (QID) | INTRAMUSCULAR | Status: DC | PRN

## 2023-11-14 MED ORDER — BUPIVACAINE-MELOXICAM ER 400-12 MG/14ML IJ SOLN
INTRAMUSCULAR | Status: AC
  Filled 2023-11-14: qty 1

## 2023-11-14 MED ORDER — CEFAZOLIN SODIUM-DEXTROSE 2-4 GM/100ML-% IV SOLN
2.0000 g | Freq: Four times a day (QID) | INTRAVENOUS | Status: AC
  Administered 2023-11-14 – 2023-11-15 (×2): 2 g via INTRAVENOUS
  Filled 2023-11-14 (×2): qty 100

## 2023-11-14 MED ORDER — OXYCODONE HCL 5 MG PO TABS
10.0000 mg | ORAL_TABLET | ORAL | Status: DC | PRN
  Administered 2023-11-15: 10 mg via ORAL
  Filled 2023-11-14 (×2): qty 2

## 2023-11-14 MED ORDER — ONDANSETRON HCL 4 MG PO TABS: 4.0000 mg | ORAL_TABLET | Freq: Four times a day (QID) | ORAL | Status: DC | PRN

## 2023-11-14 MED ORDER — METOCLOPRAMIDE HCL 5 MG/ML IJ SOLN: 5.0000 mg | Freq: Three times a day (TID) | INTRAMUSCULAR | Status: DC | PRN

## 2023-11-14 MED ORDER — TRANEXAMIC ACID-NACL 1000-0.7 MG/100ML-% IV SOLN
1000.0000 mg | INTRAVENOUS | Status: DC
  Filled 2023-11-14: qty 100

## 2023-11-14 SURGICAL SUPPLY — 78 items
ALCOHOL 70% 16 OZ (MISCELLANEOUS) ×1 IMPLANT
BAG COUNTER SPONGE SURGICOUNT (BAG) IMPLANT
BAG DECANTER FOR FLEXI CONT (MISCELLANEOUS) ×1 IMPLANT
BANDAGE ESMARK 6X9 LF (GAUZE/BANDAGES/DRESSINGS) IMPLANT
BIT DRILL QUICK REL 1/8 2PK SL (BIT) IMPLANT
BLADE SAG 18X100X1.27 (BLADE) ×1 IMPLANT
BLADE SAW SAG 90X13X1.27 (BLADE) IMPLANT
BLADE SAW SGTL 73X25 THK (BLADE) ×1 IMPLANT
BNDG ESMARK 6X9 LF (GAUZE/BANDAGES/DRESSINGS) IMPLANT
BOWL SMART MIX CTS (DISPOSABLE) ×1 IMPLANT
CEMENT BONE REFOBACIN R1X40 US (Cement) IMPLANT
CLSR STERI-STRIP ANTIMIC 1/2X4 (GAUZE/BANDAGES/DRESSINGS) ×2 IMPLANT
COOLER ICEMAN CLASSIC (MISCELLANEOUS) ×1 IMPLANT
COVER SURGICAL LIGHT HANDLE (MISCELLANEOUS) ×1 IMPLANT
CUFF TOURN SGL QUICK 42 (TOURNIQUET CUFF) IMPLANT
CUFF TRNQT CYL 34X4.125X (TOURNIQUET CUFF) ×1 IMPLANT
DERMABOND ADVANCED .7 DNX12 (GAUZE/BANDAGES/DRESSINGS) ×1 IMPLANT
DRAPE EXTREMITY T 121X128X90 (DISPOSABLE) ×1 IMPLANT
DRAPE HALF SHEET 40X57 (DRAPES) ×1 IMPLANT
DRAPE INCISE IOBAN 66X45 STRL (DRAPES) ×1 IMPLANT
DRAPE POUCH INSTRU U-SHP 10X18 (DRAPES) ×1 IMPLANT
DRAPE SURG ORHT 6 SPLT 77X108 (DRAPES) IMPLANT
DRAPE U-SHAPE 47X51 STRL (DRAPES) ×2 IMPLANT
DRSG AQUACEL AG ADV 3.5X10 (GAUZE/BANDAGES/DRESSINGS) ×1 IMPLANT
DURAPREP 26ML APPLICATOR (WOUND CARE) ×3 IMPLANT
ELECT CAUTERY BLADE 6.4 (BLADE) ×1 IMPLANT
ELECT PENCIL ROCKER SW 15FT (MISCELLANEOUS) ×1 IMPLANT
ELECT REM PT RETURN 9FT ADLT (ELECTROSURGICAL) ×1 IMPLANT
ELECTRODE REM PT RTRN 9FT ADLT (ELECTROSURGICAL) ×1 IMPLANT
FEMUR CMT CR STD SZ 6 RT KNEE (Joint) ×1 IMPLANT
FEMUR CMTD CR STD SZ 6 RT KNEE (Joint) IMPLANT
GLOVE BIOGEL PI IND STRL 7.0 (GLOVE) ×2 IMPLANT
GLOVE BIOGEL PI IND STRL 7.5 (GLOVE) ×5 IMPLANT
GLOVE ECLIPSE 7.0 STRL STRAW (GLOVE) ×3 IMPLANT
GLOVE INDICATOR 7.0 STRL GRN (GLOVE) ×1 IMPLANT
GLOVE INDICATOR 7.5 STRL GRN (GLOVE) ×1 IMPLANT
GLOVE SURG SYN 7.5 E (GLOVE) ×2 IMPLANT
GLOVE SURG SYN 7.5 PF PI (GLOVE) ×2 IMPLANT
GLOVE SURG UNDER LTX SZ7.5 (GLOVE) ×2 IMPLANT
GLOVE SURG UNDER POLY LF SZ7 (GLOVE) ×2 IMPLANT
GOWN STRL REUS W/ TWL LRG LVL3 (GOWN DISPOSABLE) ×1 IMPLANT
GOWN STRL SURGICAL XL XLNG (GOWN DISPOSABLE) ×1 IMPLANT
GOWN TOGA ZIPPER T7+ PEEL AWAY (MISCELLANEOUS) ×2 IMPLANT
HOOD PEEL AWAY T7 (MISCELLANEOUS) ×1 IMPLANT
INSERT TIB ASF PERS CD6-7 RT (Insert) IMPLANT
KIT BASIN OR (CUSTOM PROCEDURE TRAY) ×1 IMPLANT
KIT TURNOVER KIT B (KITS) ×1 IMPLANT
MANIFOLD NEPTUNE II (INSTRUMENTS) ×1 IMPLANT
MARKER SKIN DUAL TIP RULER LAB (MISCELLANEOUS) ×2 IMPLANT
NDL SPNL 18GX3.5 QUINCKE PK (NEEDLE) ×1 IMPLANT
NEEDLE SPNL 18GX3.5 QUINCKE PK (NEEDLE) ×1 IMPLANT
NS IRRIG 1000ML POUR BTL (IV SOLUTION) ×1 IMPLANT
PACK TOTAL JOINT (CUSTOM PROCEDURE TRAY) ×1 IMPLANT
PAD ARMBOARD 7.5X6 YLW CONV (MISCELLANEOUS) ×2 IMPLANT
PAD COLD SHLDR WRAP-ON (PAD) ×1 IMPLANT
PIN DRILL HDLS TROCAR 75 4PK (PIN) IMPLANT
SCREW FEMALE HEX FIX 25X2.5 (ORTHOPEDIC DISPOSABLE SUPPLIES) IMPLANT
SET HNDPC FAN SPRY TIP SCT (DISPOSABLE) ×1 IMPLANT
SOLUTION PRONTOSAN WOUND 350ML (IRRIGATION / IRRIGATOR) ×1 IMPLANT
STAPLER VISISTAT 35W (STAPLE) IMPLANT
STEM POLY PAT PLY 29M KNEE (Knees) IMPLANT
STEM TIB ST PERS 14+30 (Stem) IMPLANT
STEM TIBIA 5 DEG SZ D R KNEE (Knees) IMPLANT
SUCTION TUBE FRAZIER 10FR DISP (SUCTIONS) ×1 IMPLANT
SUT ETHILON 2 0 FS 18 (SUTURE) IMPLANT
SUT MNCRL AB 3-0 PS2 27 (SUTURE) IMPLANT
SUT STRATAFIX 1PDS 45CM VIOLET (SUTURE) IMPLANT
SUT VIC AB 0 CT1 27XBRD ANBCTR (SUTURE) ×2 IMPLANT
SUT VIC AB 1 CTX 27 (SUTURE) ×3 IMPLANT
SUT VIC AB 2-0 CT1 TAPERPNT 27 (SUTURE) ×4 IMPLANT
SYR 50ML LL SCALE MARK (SYRINGE) ×2 IMPLANT
TIBIA STEM 5 DEG SZ D R KNEE (Knees) ×1 IMPLANT
TOWEL GREEN STERILE (TOWEL DISPOSABLE) ×1 IMPLANT
TOWEL GREEN STERILE FF (TOWEL DISPOSABLE) ×1 IMPLANT
TRAY CATH INTERMITTENT SS 16FR (CATHETERS) IMPLANT
TUBE SUCT ARGYLE STRL (TUBING) ×1 IMPLANT
UNDERPAD 30X36 HEAVY ABSORB (UNDERPADS AND DIAPERS) ×1 IMPLANT
YANKAUER SUCT BULB TIP NO VENT (SUCTIONS) ×2 IMPLANT

## 2023-11-14 NOTE — Op Note (Signed)
 Total Knee Arthroplasty Procedure Note  Preoperative diagnosis: Right knee osteoarthritis  Postoperative diagnosis:same  Operative findings: Complete loss joint space from all 3 compartments Valgus deformity Flexion contracture Preop PROM 15-95 degrees Postop PROM 0-130 degrees  Operative procedure: Right total knee arthroplasty. CPT [id number]  Surgeon: N. Glee Arvin, MD  Assist: Oneal Grout, PA-C; necessary for the timely completion of procedure and due to complexity of procedure.  Anesthesia: Spinal, regional, local  Tourniquet time: see anesthesia record  Implants used: Zimmer persona Femur: CR 6 Tibia: D with 30 mm cemented stem Patella: 29 mm Polyethylene: 11 mm medial congruent  Indication: Lisa Fletcher is a 78 y.o. year old female with a history of knee pain. Having failed conservative management, the patient elected to proceed with a total knee arthroplasty.  We have reviewed the risk and benefits of the surgery and they elected to proceed after voicing understanding.  Procedure:  After informed consent was obtained and understanding of the risk were voiced including but not limited to bleeding, infection, damage to surrounding structures including nerves and vessels, blood clots, leg length inequality and the failure to achieve desired results, the operative extremity was marked with verbal confirmation of the patient in the holding area.   The patient was then brought to the operating room and transported to the operating room table in the supine position.  A tourniquet was applied to the operative extremity around the upper thigh. The operative limb was then prepped and draped in the usual sterile fashion and preoperative antibiotics were administered.  A time out was performed prior to the start of surgery confirming the correct extremity, preoperative antibiotic administration, as well as team members, implants and instruments available for the case.  Correct surgical site was also confirmed with preoperative radiographs. The limb was then elevated for exsanguination and the tourniquet was inflated. A midline incision was made and a standard medial parapatellar approach was performed.  The infrapatellar fat pad was removed.  Suprapatellar synovium was removed to reveal the anterior distal femoral cortex.  A medial peel was performed to release the capsule and the deep MCL off of the medial tibial plateau back to the semimembranosus.  The patella was then everted which showed complete loss of articular cartilage and numerous degenerative osteophytes.  These were all removed with a rongeur. Patella was resected from 18 mm down to 13 mm and was prepared and sized to a 29 mm.  A cover was placed on the patella for protection from retractors.  The knee was then brought into full flexion and we then turned our attention to the femur.  The ACL and PCL were sacrificed.  Start site was drilled in the femur and the intramedullary distal femoral cutting guide was placed, set at 6 degrees valgus, taking 14 mm of distal resection for flexion contracture. The distal cut was made. Osteophytes were then removed.  Next, the proximal tibial cutting guide was placed with appropriate slope, varus/valgus alignment and depth of resection.  The drop rod was attached to confirm that it was aimed at the second metatarsal.  The proximal tibial cut was made taking 4 mm off the low lateral side. Gap blocks were then used to assess the extension gap and alignment, and appropriate soft tissue releases were performed. Attention was turned back to the femur, which was sized using the sizing guide to a size 6. Appropriate rotation of the femoral component was determined using epicondylar axis, Whiteside's line, and assessing the flexion  gap under ligament tension. The appropriate size 4-in-1 cutting block was placed and checked with an angel wing and cuts were made. Posterior femoral  osteophytes and uncapped bone were then removed with the curved osteotome.  The menisci were removed.  Trial components were placed, and stability was checked in full extension, mid-flexion, and deep flexion.  PCL was resected to balance the flexion space. Proper tibial rotation was determined and marked.  The patella tracked well without a lateral release.  The femoral lugs were then drilled. Trial components were then removed and tibial preparation performed.  The trial tibia was pointed to the medial third of the tibial tubercle.  The tibia was sized for a size D component.   The bony surfaces were irrigated with a pulse lavage and then dried. Bone cement was vacuum mixed on the back table, and the final components sized above were cemented into place.  Antibiotic irrigation was placed in the knee joint and soft tissues while the cement cured.  After cement had finished curing, excess cement was removed. The stability of the construct was re-evaluated throughout a range of motion and found to be acceptable. The trial liner was removed, the knee was copiously irrigated, and the knee was re-evaluated for any excess bone debris. The real polyethylene liner, 11 mm thick, was inserted and checked to ensure the locking mechanism had engaged appropriately. The tourniquet was deflated and hemostasis was achieved. The wound was irrigated with normal saline.  One gram of vancomycin powder was placed in the surgical bed.  Topical mixture of 0.25% bupivacaine and meloxicam was placed in the joint for postoperative pain.  Capsular closure was performed with a #1 stratafix in flexion, subcutaneous fat closed with a 0 vicryl suture, then subcutaneous tissue closed with interrupted 2.0 vicryl suture. The skin was then closed with a 2.0 nylon and dermabond. A sterile dressing was applied.  The patient was awakened in the operating room and taken to recovery in stable condition. All sponge, needle, and instrument counts were  correct at the end of the case.  Tessa Lerner was necessary for opening, closing, retracting, limb positioning and overall facilitation and completion of the surgery.  Position: supine  Complications: none.  Time Out: performed   Drains/Packing: none  Estimated blood loss: minimal  Returned to Recovery Room: in good condition.   Antibiotics: yes   Mechanical VTE (DVT) Prophylaxis: sequential compression devices, TED thigh-high  Chemical VTE (DVT) Prophylaxis: xarelto  Fluid Replacement  Crystalloid: see anesthesia record Blood: none  FFP: none   Specimens Removed: 1 to pathology   Sponge and Instrument Count Correct? yes   PACU: portable radiograph - knee AP and Lateral   Plan/RTC: Return in 2 weeks for wound check.   Weight Bearing/Load Lower Extremity: full   Implant Name Type Inv. Item Serial No. Manufacturer Lot No. LRB No. Used Action  CEMENT BONE REFOBACIN R1X40 [place] - [id number] Cement CEMENT BONE REFOBACIN R1X40 [place]  ZIMMER RECON(ORTH,TRAU,BIO,SG) [id number] Right 1 Implanted  CEMENT BONE REFOBACIN R1X40 [place] - [id number] Cement CEMENT BONE REFOBACIN R1X40 [place]  ZIMMER RECON(ORTH,TRAU,BIO,SG) [id number] Right 1 Implanted  FEMUR CMT CR STD SZ 6 RT KNEE - [id number] Joint FEMUR CMT CR STD SZ 6 RT KNEE  ZIMMER RECON(ORTH,TRAU,BIO,SG) [id number] Right 1 Implanted  STEM POLY PAT PLY 32M KNEE - [id number] Knees STEM POLY PAT PLY 32M KNEE  ZIMMER RECON(ORTH,TRAU,BIO,SG) [id number] Right 1 Implanted  STEM TIB ST PERS 14+30 - [id number] Stem STEM TIB ST PERS 14+30  ZIMMER RECON(ORTH,TRAU,BIO,SG) [id number] Right 1 Implanted  TIBIA STEM 5 DEG SZ D R KNEE - [id number] Knees TIBIA STEM 5 DEG SZ D R KNEE  ZIMMER RECON(ORTH,TRAU,BIO,SG) [id number] Right 1 Implanted  INSERT TIB ASF PERS CD6-7 RT - [id number] Insert INSERT TIB ASF PERS CD6-7 RT  ZIMMER RECON(ORTH,TRAU,BIO,SG) [id number] Right 1 Implanted    N. Glee Arvin, MD [facility] 1:53 PM

## 2023-11-14 NOTE — Anesthesia Procedure Notes (Signed)
 Spinal  Patient location during procedure: OR End time: 11/14/2023 12:31 PM Reason for block: surgical anesthesia Staffing Performed: anesthesiologist  Anesthesiologist: Jairo Ben, MD Performed by: Jairo Ben, MD Authorized by: Jairo Ben, MD   Preanesthetic Checklist Completed: patient identified, IV checked, site marked, risks and benefits discussed, surgical consent, monitors and equipment checked, pre-op evaluation and timeout performed Spinal Block Patient position: sitting Prep: DuraPrep and site prepped and draped Patient monitoring: blood pressure, continuous pulse ox, cardiac monitor and heart rate Approach: midline Location: L2-3 Injection technique: single-shot Needle Needle type: Pencan and Introducer  Needle gauge: 24 G Needle length: 9 cm Assessment Events: CSF return Additional Notes Pt identified in Operating room.  Monitors applied. Working IV access confirmed. Sterile prep, drape lumbar spine.  1% lido local L 3,4.  #24ga Pencan, os, repeat local L2,3 and into clear CSF.  12mg  0.75% Bupivacaine with dextrose injected with asp CSF beginning and end of injection.  Patient asymptomatic, VSS, no heme aspirated, tolerated well.  Sandford Craze, MD

## 2023-11-14 NOTE — H&P (Signed)
 PREOPERATIVE H&P  Chief Complaint: right hip osteoarthritis  HPI: Lisa Fletcher is a 78 y.o. female who presents for surgical treatment of right hip osteoarthritis.  She denies any changes in medical history.  Past Surgical History:  Procedure Laterality Date   ABDOMINAL HYSTERECTOMY     1970s   CARPAL TUNNEL RELEASE Left 04/20/2017   Procedure: CARPAL TUNNEL RELEASE ENDOSCOPIC;  Surgeon: Christena Flake, MD;  Location: [facility];  Service: Orthopedics;  Laterality: Left;  Diabetic - insulin   CARPAL TUNNEL RELEASE Right 06/08/2017   Procedure: CARPAL TUNNEL RELEASE ENDOSCOPIC;  Surgeon: Christena Flake, MD;  Location: [facility];  Service: Orthopedics;  Laterality: Right;   CATARACT EXTRACTION W/PHACO Right 07/10/2019   Procedure: CATARACT EXTRACTION PHACO AND INTRAOCULAR LENS PLACEMENT (IOC) RIGHT DIABETIC  01:04.9  15.3%  9.97;  Surgeon: Galen Manila, MD;  Location: [facility];  Service: Ophthalmology;  Laterality: Right;  diabetic - insulin   CATARACT EXTRACTION W/PHACO Left 12/02/2020   Procedure: CATARACT EXTRACTION PHACO AND INTRAOCULAR LENS PLACEMENT (IOC) LEFT DIABETIC 4.85 00:39.9;  Surgeon: Galen Manila, MD;  Location: [facility];  Service: Ophthalmology;  Laterality: Left;  Diabetic - insulin   COLONOSCOPY  2024   Social History   Socioeconomic History   Marital status: Divorced    Spouse name: Not on file   Number of children: Not on file   Years of education: Not on file   Highest education level: Not on file  Occupational History   Not on file  Tobacco Use   Smoking status: Never   Smokeless tobacco: Never  Vaping Use   Vaping status: Never Used  Substance and Sexual Activity   Alcohol use: No   Drug use: Never   Sexual activity: Not Currently  Other Topics Concern   Not on file  Social History Narrative   Not on file   Social Drivers of Health   Financial Resource Strain: Low Risk  (07/14/2023)    Received from [facility]   Overall Financial Resource Strain (CARDIA)    Difficulty of Paying Living Expenses: Not hard at all  Food Insecurity: No Food Insecurity (07/14/2023)   Received from [facility]   Hunger Vital Sign    Worried About Running Out of Food in the Last Year: Never true    Ran Out of Food in the Last Year: Never true  Transportation Needs: No Transportation Needs (07/14/2023)   Received from [facility]   PRAPARE - Transportation    Lack of Transportation (Non-Medical): No    In the past 12 months, has lack of transportation kept you from medical appointments or from getting medications?: No  Physical Activity: Not on file  Stress: Not on file  Social Connections: Not on file   Family History  Problem Relation Age of Onset   Cancer Sister    Diabetes Sister    Hypertension Sister    Breast cancer Sister    Deep vein thrombosis Brother    Diabetes Brother    Hypertension Brother    Allergies  Allergen Reactions   Ancef [Cefazolin] Nausea And Vomiting   Statins     Other reaction(s): Muscle Pain atorvastatin   Prior to Admission medications   Medication Sig Start Date End Date Taking? Authorizing Provider  Ascorbic Acid (VITAMIN C PO) Take 1 tablet by mouth daily.   Yes [provider]  aspirin 81 MG tablet Take 81  mg by mouth at bedtime.   Yes [provider]  Cholecalciferol (VITAMIN D) 2000 units tablet Take 2,000 Units by mouth daily.   Yes [provider]  docusate sodium (COLACE) 100 MG capsule Take 1 capsule (100 mg total) by mouth daily as needed. 11/07/23 11/06/24  Cristie Hem, PA-C  doxycycline (VIBRAMYCIN) 100 MG capsule Take 1 capsule (100 mg total) by mouth 2 (two) times daily. To be taken after surgery 11/07/23   Cristie Hem, PA-C  [facility] BILOBA PO Take 1 capsule by mouth daily.   Yes [provider]  insulin NPH Human (HUMULIN N,NOVOLIN N) 100  UNIT/ML injection Inject 30 Units into the skin 2 (two) times daily before a meal.   Yes [provider]  lisinopril (PRINIVIL,ZESTRIL) 10 MG tablet Take 10 mg by mouth daily. 10/11/16 11/14/23 Yes [provider]  methocarbamol (ROBAXIN-750) 750 MG tablet Take 1 tablet (750 mg total) by mouth 2 (two) times daily as needed for muscle spasms. 11/07/23   Cristie Hem, PA-C  Multiple Vitamin (MULTIVITAMIN WITH MINERALS) TABS tablet Take 1 tablet by mouth daily.   Yes [provider]  ondansetron (ZOFRAN) 4 MG tablet Take 1 tablet (4 mg total) by mouth every 8 (eight) hours as needed for nausea or vomiting. 11/07/23   Cristie Hem, PA-C  oxyCODONE-acetaminophen (PERCOCET) 5-325 MG tablet Take 1-2 tablets by mouth every 8 (eight) hours as needed. To be taken after surgery 11/07/23   Cristie Hem, PA-C  rivaroxaban (XARELTO) 10 MG TABS tablet Take 1 tablet (10 mg total) by mouth daily. To be taken after surgery to prevent blood clots 11/07/23   Cristie Hem, PA-C  rosuvastatin (CRESTOR) 20 MG tablet Take 20 mg by mouth daily.   Yes [provider]  traZODone (DESYREL) 50 MG tablet Take 50 mg by mouth at bedtime.   Yes [provider]  Insulin Syringe-Needle U-100 (INSULIN SYRINGE .5CC/31GX5/16") 31G X 5/16" 0.5 ML MISC Use as directed 5 times daily. 06/07/16   [provider]  OZEMPIC, 1 MG/DOSE, 4 MG/3ML SOPN Inject 1 mg into the skin once a week.    [provider]     Positive ROS: All other systems have been reviewed and were otherwise negative with the exception of those mentioned in the HPI and as above.  Physical Exam: General: Alert, no acute distress Cardiovascular: No pedal edema Respiratory: No cyanosis, no use of accessory musculature GI: abdomen soft Skin: No lesions in the area of chief complaint Neurologic: Sensation intact distally Psychiatric: Patient is competent for consent with normal mood and  affect Lymphatic: no lymphedema  MUSCULOSKELETAL: exam stable  Assessment: right hip osteoarthritis  Plan: Plan for Procedure(s): RIGHT TOTAL KNEE ARTHROPLASTY  The risks benefits and alternatives were discussed with the patient including but not limited to the risks of nonoperative treatment, versus surgical intervention including infection, bleeding, nerve injury,  blood clots, cardiopulmonary complications, morbidity, mortality, among others, and they were willing to proceed.   Glee Arvin, MD 11/14/2023 10:34 AM

## 2023-11-14 NOTE — Discharge Instructions (Signed)

## 2023-11-14 NOTE — Anesthesia Procedure Notes (Signed)
 Anesthesia Regional Block: Adductor canal block   Pre-Anesthetic Checklist: , timeout performed,  Correct Patient, Correct Site, Correct Laterality,  Correct Procedure, Correct Position, site marked,  Risks and benefits discussed,  Surgical consent,  Pre-op evaluation,  At surgeon's request and post-op pain management  Laterality: Right and Lower  Prep: chloraprep       Needles:  Injection technique: Single-shot  Needle Type: Echogenic Needle     Needle Length: 9cm  Needle Gauge: 21     Additional Needles:   Procedures:,,,, ultrasound used (permanent image in chart),,    Narrative:  Start time: 11/14/2023 11:42 AM End time: 11/14/2023 11:48 AM Injection made incrementally with aspirations every 5 mL.  Performed by: Personally  Anesthesiologist: Jairo Ben, MD  Additional Notes: Pt identified in Holding room.  Monitors applied. Working IV access confirmed. Timeout, Sterile prep R thigh.  #21ga ECHOgenic Arrow block needle into adductor canal with US guidance.  20cc 0.75% Ropivacaine injected incrementally after negative test dose.  Patient asymptomatic, VSS, no heme aspirated, tolerated well.   Sandford Craze, MD

## 2023-11-14 NOTE — Transfer of Care (Signed)
 Immediate Anesthesia Transfer of Care Note  Patient: Lisa Fletcher  Procedure(s) Performed: RIGHT TOTAL KNEE ARTHROPLASTY (Right: Knee)  Patient Location: PACU  Anesthesia Type:Spinal  Level of Consciousness: awake, alert , and patient cooperative  Airway & Oxygen Therapy: Patient Spontanous Breathing  Post-op Assessment: Report given to RN and Post -op Vital signs reviewed and stable  Post vital signs: Reviewed and stable  Last Vitals:  Vitals Value Taken Time  BP 116/69 11/14/23 1435  Temp    Pulse 76 11/14/23 1435  Resp 17 11/14/23 1435  SpO2 95 % 11/14/23 1435    Last Pain:  Vitals:   11/14/23 0957  TempSrc:   PainSc: 0-No pain         Complications: No notable events documented.

## 2023-11-15 ENCOUNTER — Encounter (HOSPITAL_COMMUNITY): Payer: Self-pay | Admitting: Orthopaedic Surgery

## 2023-11-15 DIAGNOSIS — M1711 Unilateral primary osteoarthritis, right knee: Secondary | ICD-10-CM | POA: Diagnosis not present

## 2023-11-15 LAB — GLUCOSE, CAPILLARY: Glucose-Capillary: 150 mg/dL — ABNORMAL HIGH (ref 70–99)

## 2023-11-15 NOTE — Anesthesia Postprocedure Evaluation (Signed)
 Anesthesia Post Note  Patient: Lisa Fletcher  Procedure(s) Performed: RIGHT TOTAL KNEE ARTHROPLASTY (Right: Knee)     Patient location during evaluation: PACU Anesthesia Type: Spinal and Regional Level of consciousness: oriented and awake and alert Pain management: pain level controlled Vital Signs Assessment: post-procedure vital signs reviewed and stable Respiratory status: spontaneous breathing, respiratory function stable and patient connected to nasal cannula oxygen Cardiovascular status: blood pressure returned to baseline and stable Postop Assessment: no headache, no backache and no apparent nausea or vomiting Anesthetic complications: no   No notable events documented.  Last Vitals:  Vitals:   11/15/23 0608 11/15/23 0656  BP: 95/67 (!) 112/57  Pulse: 89 69  Resp: 17   Temp: 36.7 C   SpO2: 97% 99%    Last Pain:  Vitals:   11/15/23 0612  TempSrc:   PainSc: 6                  [name]

## 2023-11-15 NOTE — Plan of Care (Signed)
  Problem: Education: Goal: Knowledge of General Education information will improve Description: Including pain rating scale, medication(s)/side effects and non-pharmacologic comfort measures Outcome: Completed/Met   Problem: Health Behavior/Discharge Planning: Goal: Ability to manage health-related needs will improve Outcome: Completed/Met   Problem: Clinical Measurements: Goal: Ability to maintain clinical measurements within normal limits will improve Outcome: Completed/Met Goal: Will remain free from infection Outcome: Completed/Met Goal: Diagnostic test results will improve Outcome: Completed/Met Goal: Respiratory complications will improve Outcome: Completed/Met Goal: Cardiovascular complication will be avoided Outcome: Completed/Met   Problem: Nutrition: Goal: Adequate nutrition will be maintained Outcome: Completed/Met   Problem: Coping: Goal: Level of anxiety will decrease Outcome: Completed/Met   Problem: Elimination: Goal: Will not experience complications related to bowel motility Outcome: Completed/Met Goal: Will not experience complications related to urinary retention Outcome: Completed/Met   Problem: Pain Managment: Goal: General experience of comfort will improve and/or be controlled Outcome: Completed/Met   Problem: Safety: Goal: Ability to remain free from injury will improve Outcome: Completed/Met   Problem: Skin Integrity: Goal: Risk for impaired skin integrity will decrease Outcome: Completed/Met   Problem: Education: Goal: Ability to describe self-care measures that may prevent or decrease complications (Diabetes Survival Skills Education) will improve Outcome: Completed/Met Goal: Individualized Educational Video(s) Outcome: Completed/Met   Problem: Pain Management: Goal: Pain level will decrease with appropriate interventions Outcome: Completed/Met

## 2023-11-15 NOTE — Evaluation (Signed)
 Physical Therapy Evaluation Patient Details Name: Lisa Fletcher MRN: [id number] DOB: [birth year] Today's Date: 11/15/2023  History of Present Illness  78 y.o. female who was admitted 11/14/2023 for operative treatment of total right knee replacement. PMHx: DMII, HTN, hyperlipidemia, motion sickness,   Clinical Impression  Pt is s/p Rt TKA presenting with the deficits listed below (see PT Problem List). Reviewed precautions, modalities, LE exercises, and safe assistive device use. Handouts provided, son present and very supportive. Pt able to ambulate 60 feet but further distance limited by onset of nausea and vomiting. Supervision with RW for support, no overt buckling, step-through pattern. She will not need to navigate any stairs immediately upon returning home. Son will be staying with her to assist as needed. Adequate for d/c from a mobility standpoint, once medically cleared. Will continue to follow and progress while present acutely. Pt will benefit from acute skilled PT to increase their independence and safety with mobility to allow discharge.          If plan is discharge home, recommend the following: A little help with walking and/or transfers;Assistance with cooking/housework;Assist for transportation   Can travel by private vehicle        Equipment Recommendations None recommended by PT  Recommendations for Other Services       Functional Status Assessment Patient has had a recent decline in their functional status and demonstrates the ability to make significant improvements in function in a reasonable and predictable amount of time.     Precautions / Restrictions Precautions Precautions: Knee Precaution Booklet Issued: Yes (comment) Recall of Precautions/Restrictions: Intact Restrictions Weight Bearing Restrictions Per Provider Order: Yes RLE Weight Bearing Per Provider Order: Weight bearing as tolerated      Mobility  Bed Mobility Overal bed mobility: Needs  Assistance Bed Mobility: Supine to Sit     Supine to sit: Supervision     General bed mobility comments: Supervision for safety, no physical assist required    Transfers Overall transfer level: Needs assistance Equipment used: Rolling walker (2 wheels) Transfers: Sit to/from Stand Sit to Stand: Supervision           General transfer comment: Supervision for safety, no physical assist needed. Cues for technique with transitions: Hand and LE placement.    Ambulation/Gait Ambulation/Gait assistance: Supervision Gait Distance (Feet): 60 Feet Assistive device: Rolling walker (2 wheels) Gait Pattern/deviations: Step-through pattern, Decreased stride length, Decreased stance time - right, Knee flexed in stance - right, Antalgic Gait velocity: dec Gait velocity interpretation: <1.31 ft/sec, indicative of household ambulator   General Gait Details: Moderately antalgic patter, progressed to step-through pattern with cues, good heel strike. Educated on safe AD use with RW for support. No overt buckling noted. Able to gradually extend knee near terminal stance. Distance limited by onset of nausea + vomiting. Supervision for safety thoughout bout.  Stairs            Wheelchair Mobility     Tilt Bed    Modified Rankin (Stroke Patients Only)       Balance Overall balance assessment: Needs assistance Sitting-balance support: No upper extremity supported, Feet supported Sitting balance-Leahy Scale: Good     Standing balance support: No upper extremity supported Standing balance-Leahy Scale: Poor Standing balance comment: Stable with RW.                             Pertinent Vitals/Pain Pain Assessment Pain Assessment: Faces Faces Pain Scale: Hurts  little more Pain Location: rt kenee Pain Descriptors / Indicators: Aching, Operative site guarding Pain Intervention(s): Monitored during session, Repositioned, Limited activity within patient's tolerance     Home Living Family/patient expects to be discharged to:: Private residence Living Arrangements: Alone Available Help at Discharge: Family;Available 24 hours/day (Son staying with pt first week or so) Type of Home: House (Townhome) Home Access: Level entry     Alternate Level Stairs-Number of Steps: flight Home Layout: Two level;Able to live on main level with bedroom/bathroom Home Equipment: Agricultural consultant (2 wheels)      Prior Function Prior Level of Function : Independent/Modified Independent;Driving             Mobility Comments: ind no AD ADLs Comments: ind, driving     Extremity/Trunk Assessment   Upper Extremity Assessment Upper Extremity Assessment: Defer to OT evaluation    Lower Extremity Assessment Lower Extremity Assessment: RLE deficits/detail RLE Deficits / Details: Post op guarding Rt knee as expected.  SLR without lag. Passive extension near full, seated EOB, flexion close to 90 with self applied overpressure. RLE: Unable to fully assess due to pain       Communication   Communication Communication: No apparent difficulties    Cognition Arousal: Alert Behavior During Therapy: WFL for tasks assessed/performed   PT - Cognitive impairments: No apparent impairments                         Following commands: Intact       Cueing Cueing Techniques: Verbal cues     General Comments General comments (skin integrity, edema, etc.): Reviewed precautions and HEP with pt and son. Very supportive family. Encouraged ice use    Exercises Total Joint Exercises Ankle Circles/Pumps: AROM, Both, 10 reps, Supine Quad Sets: Strengthening, Both, 10 reps, Supine Towel Squeeze: Strengthening, Both, 10 reps, Supine Short Arc Quad: Strengthening, Right, 5 reps, Supine Heel Slides: Supine, AROM, Right, 5 reps Hip ABduction/ADduction: Strengthening, Right, 5 reps, Supine Straight Leg Raises: Strengthening, Right, 5 reps, Supine Long Arc Quad:  Strengthening, Right, 5 reps, Seated Knee Flexion: AROM, Right, PROM, 5 reps, Seated   Assessment/Plan    PT Assessment Patient needs continued PT services  PT Problem List Decreased strength;Decreased range of motion;Decreased activity tolerance;Decreased balance;Decreased mobility;Decreased knowledge of use of DME;Decreased knowledge of precautions;Pain       PT Treatment Interventions DME instruction;Gait training;Stair training;Functional mobility training;Therapeutic activities;Therapeutic exercise;Balance training;Neuromuscular re-education;Patient/family education;Modalities;Manual techniques    PT Goals (Current goals can be found in the Care Plan section)  Acute Rehab PT Goals Patient Stated Goal: Get well, return home PT Goal Formulation: With patient Time For Goal Achievement: 11/29/23 Potential to Achieve Goals: Good    Frequency 7X/week     Co-evaluation               AM-PAC PT "6 Clicks" Mobility  Outcome Measure Help needed turning from your back to your side while in a flat bed without using bedrails?: None Help needed moving from lying on your back to sitting on the side of a flat bed without using bedrails?: A Little Help needed moving to and from a bed to a chair (including a wheelchair)?: A Little Help needed standing up from a chair using your arms (e.g., wheelchair or bedside chair)?: A Little Help needed to walk in hospital room?: A Little Help needed climbing 3-5 steps with a railing? : A Little 6 Click Score: 19    End of  Session Equipment Utilized During Treatment: Gait belt Activity Tolerance: Patient tolerated treatment well (Nausea at end of session + Vomiting) Patient left: in bed;with call bell/phone within reach;with nursing/sitter in room;with family/visitor present Nurse Communication: Mobility status PT Visit Diagnosis: Unsteadiness on feet (R26.81);Other abnormalities of gait and mobility (R26.89);Pain Pain - Right/Left: Right Pain -  part of body: Knee    Time: 1610-9604 PT Time Calculation (min) (ACUTE ONLY): 22 min   Charges:   PT Evaluation $PT Eval Low Complexity: 1 Low   PT General Charges $$ ACUTE PT VISIT: 1 Visit         .lsb   Berton Mount 11/15/2023, 9:22 AM

## 2023-11-15 NOTE — Discharge Summary (Signed)
 Patient ID: Lisa Fletcher MRN: [id number] DOB/AGE: 77/05/1946 77 y.o.  Admit date: 11/14/2023 Discharge date: 11/15/2023  Admission Diagnoses:  Principal Problem:   Status post total right knee replacement   Discharge Diagnoses:  Same  Past Medical History:  Diagnosis Date   Arthritis    Diabetes mellitus, type 2 (HCC)    Hyperlipidemia    Hypertension    Motion sickness    car(back seat) and circular motion   Wears dentures    partial upper    Surgeries: Procedure(s): RIGHT TOTAL KNEE ARTHROPLASTY on 11/14/2023   Consultants:   Discharged Condition: Improved  Hospital Course: JASMIA ANGST is an 78 y.o. female who was admitted 11/14/2023 for operative treatment ofStatus post total right knee replacement. Patient has severe unremitting pain that affects sleep, daily activities, and work/hobbies. After pre-op clearance the patient was taken to the operating room on 11/14/2023 and underwent  Procedure(s): RIGHT TOTAL KNEE ARTHROPLASTY.    Patient was given perioperative antibiotics:  Anti-infectives (From admission, onward)    Start     Dose/Rate Route Frequency Ordered Stop   11/14/23 2200  doxycycline (VIBRAMYCIN) 50 MG capsule 100 mg       Note to Pharmacy: To be taken after surgery     100 mg Oral 2 times daily 11/14/23 1737     11/14/23 1745  ceFAZolin (ANCEF) IVPB 2g/100 mL premix        2 g 200 mL/hr over 30 Minutes Intravenous Every 6 hours 11/14/23 1737 11/15/23 0112   11/14/23 1303  vancomycin (VANCOCIN) powder  Status:  Discontinued          As needed 11/14/23 1303 11/14/23 1431   11/14/23 0945  levofloxacin (LEVAQUIN) IVPB 500 mg        500 mg 100 mL/hr over 60 Minutes Intravenous On call to O.R. 11/14/23 0935 11/14/23 1322   11/14/23 0945  vancomycin (VANCOCIN) IVPB 1000 mg/200 mL premix        1,000 mg 200 mL/hr over 60 Minutes Intravenous On call to O.R. 11/14/23 0935 11/14/23 1117        Patient was given sequential compression devices, early  ambulation, and chemoprophylaxis to prevent DVT.  Patient benefited maximally from hospital stay and there were no complications.    Recent vital signs: Patient Vitals for the past 24 hrs:  BP Temp Temp src Pulse Resp SpO2 Height Weight  11/15/23 0656 (!) 112/57 -- -- 69 -- 99 % -- --  11/15/23 0608 95/67 98.1 F (36.7 C) Oral 89 17 97 % -- --  11/14/23 2330 (!) 106/55 98.7 F (37.1 C) Oral 77 20 100 % -- --  11/14/23 2049 (!) 112/53 98.3 F (36.8 C) Oral (!) 50 20 98 % -- --  11/14/23 1801 126/74 97.7 F (36.5 C) -- 70 20 99 % -- --  11/14/23 1730 125/67 -- -- 66 12 97 % -- --  11/14/23 1700 128/63 -- -- 69 10 96 % -- --  11/14/23 1630 119/69 -- -- 79 18 96 % -- --  11/14/23 1600 123/73 97.7 F (36.5 C) -- 67 13 94 % -- --  11/14/23 1545 127/73 -- -- 60 14 93 % -- --  11/14/23 1530 128/72 -- -- 70 14 96 % -- --  11/14/23 1515 128/74 -- -- 67 13 95 % -- --  11/14/23 1500 121/74 -- -- 68 14 98 % -- --  11/14/23 1445 119/72 -- -- 69 14 98 % -- --  11/14/23 1435 116/69 (!) 96.8 F (36 C) -- 76 17 95 % -- --  11/14/23 1156 128/65 -- -- 64 13 96 % -- --  11/14/23 1150 125/67 -- -- 63 12 99 % -- --  11/14/23 1148 (!) 143/77 -- -- 73 12 99 % -- --  11/14/23 0913 (!) 156/82 98 F (36.7 C) Oral 75 17 98 % 5\' 2"  (1.575 m) 71.2 kg     Recent laboratory studies: No results for input(s): "WBC", "HGB", "HCT", "PLT", "NA", "K", "CL", "CO2", "BUN", "CREATININE", "GLUCOSE", "INR", "CALCIUM" in the last 72 hours.  Invalid input(s): "PT", "2"   Discharge Medications:   Allergies as of 11/15/2023       Reactions   Ancef [cefazolin] Nausea And Vomiting   Statins    Other reaction(s): Muscle Pain atorvastatin        Medication List     STOP taking these medications    aspirin 81 MG tablet       TAKE these medications    docusate sodium 100 MG capsule Commonly known as: Colace Take 1 capsule (100 mg total) by mouth daily as needed.   doxycycline 100 MG capsule Commonly  known as: Vibramycin Take 1 capsule (100 mg total) by mouth 2 (two) times daily. To be taken after surgery   GINKGO BILOBA PO Take 1 capsule by mouth daily.   insulin NPH Human 100 UNIT/ML injection Commonly known as: NOVOLIN N Inject 30 Units into the skin 2 (two) times daily before a meal.   INSULIN SYRINGE .5CC/31GX5/16" 31G X 5/16" 0.5 ML Misc Use as directed 5 times daily.   lisinopril 10 MG tablet Commonly known as: ZESTRIL Take 10 mg by mouth daily.   methocarbamol 750 MG tablet Commonly known as: Robaxin-750 Take 1 tablet (750 mg total) by mouth 2 (two) times daily as needed for muscle spasms.   multivitamin with minerals Tabs tablet Take 1 tablet by mouth daily.   ondansetron 4 MG tablet Commonly known as: Zofran Take 1 tablet (4 mg total) by mouth every 8 (eight) hours as needed for nausea or vomiting.   oxyCODONE-acetaminophen 5-325 MG tablet Commonly known as: Percocet Take 1-2 tablets by mouth every 8 (eight) hours as needed. To be taken after surgery   Ozempic (1 MG/DOSE) 4 MG/3ML Sopn Generic drug: Semaglutide (1 MG/DOSE) Inject 1 mg into the skin once a week.   rivaroxaban 10 MG Tabs tablet Commonly known as: XARELTO Take 1 tablet (10 mg total) by mouth daily. To be taken after surgery to prevent blood clots   rosuvastatin 20 MG tablet Commonly known as: CRESTOR Take 20 mg by mouth daily.   traZODone 50 MG tablet Commonly known as: DESYREL Take 50 mg by mouth at bedtime.   VITAMIN C PO Take 1 tablet by mouth daily.   Vitamin D 50 MCG (2000 UT) tablet Take 2,000 Units by mouth daily.               Durable Medical Equipment  (From admission, onward)           Start     Ordered   11/14/23 1738  DME Walker rolling  Once       Question Answer Comment  Walker: With 5 Inch Wheels   Patient needs a walker to treat with the following condition Status post left partial knee replacement      11/14/23 1737   11/14/23 1738  DME 3 n 1   Once  11/14/23 1737   11/14/23 1738  DME Bedside commode  Once       Question:  Patient needs a bedside commode to treat with the following condition  Answer:  Status post left partial knee replacement   11/14/23 1737            Diagnostic Studies: DG Knee Right Port Result Date: 11/14/2023 CLINICAL DATA:  Postop right knee. EXAM: PORTABLE RIGHT KNEE - 1-2 VIEW COMPARISON:  None Available. FINDINGS: Right knee arthroplasty in expected alignment. No periprosthetic lucency or fracture. There has been patellar resurfacing. Recent postsurgical change includes air and edema in the soft tissues and joint space. IMPRESSION: Right knee arthroplasty without immediate postoperative complication. Electronically Signed   By: Narda Rutherford M.D.   On: 11/14/2023 15:50    Disposition: Discharge disposition: 01-Home or Self Care          Follow-up Information     Cristie Hem, PA-C. Schedule an appointment as soon as possible for a visit in 2 week(s).   Specialty: Orthopedic Surgery Contact information: [address] [place] [address] [phone]         [facility] Follow up.   Specialty: Home Health Services Why: The home health agency will contact you for the first home visit. Contact information: [address] [place] [address] [phone]                  Signed: Cristie Hem 11/15/2023, 8:11 AM

## 2023-11-15 NOTE — Progress Notes (Signed)
 Patient alert and oriented, void, ambulate. Surgical site clean and dry no sign of infection. D/c instructions explain and given.

## 2023-11-15 NOTE — Progress Notes (Signed)
 Subjective: 1 Day Post-Op Procedure(s) (LRB): RIGHT TOTAL KNEE ARTHROPLASTY (Right) Patient reports pain as mild.    Objective: Vital signs in last 24 hours: Temp:  [96.8 F (36 C)-98.7 F (37.1 C)] 98.1 F (36.7 C) (02/18 1610) Pulse Rate:  [50-89] 69 (02/18 0656) Resp:  [10-20] 17 (02/18 0608) BP: (95-156)/(53-82) 112/57 (02/18 0656) SpO2:  [93 %-100 %] 99 % (02/18 0656) Weight:  [71.2 kg] 71.2 kg (02/17 0913)  Intake/Output from previous day: 02/17 0701 - 02/18 0700 In: 240 [P.O.:240] Out: 1350 [Urine:1300; Blood:50] Intake/Output this shift: No intake/output data recorded.  No results for input(s): "HGB" in the last 72 hours. No results for input(s): "WBC", "RBC", "HCT", "PLT" in the last 72 hours. No results for input(s): "NA", "K", "CL", "CO2", "BUN", "CREATININE", "GLUCOSE", "CALCIUM" in the last 72 hours. No results for input(s): "LABPT", "INR" in the last 72 hours.  Neurologically intact Neurovascular intact Sensation intact distally Intact pulses distally Dorsiflexion/Plantar flexion intact Incision: dressing C/D/I No cellulitis present Compartment soft   Assessment/Plan: 1 Day Post-Op Procedure(s) (LRB): RIGHT TOTAL KNEE ARTHROPLASTY (Right) Advance diet Up with therapy D/C IV fluids Discharge home with home health once cleared by PT and able to urinate on her own WBAT RLE      Cristie Hem 11/15/2023, 8:09 AM

## 2023-11-15 NOTE — Evaluation (Signed)
 Occupational Therapy Evaluation Patient Details Name: Lisa Fletcher MRN: [id number] DOB: [birth year] Today's Date: 11/15/2023   History of Present Illness   78 y.o. female who was admitted 11/14/2023 for operative treatment of total right knee replacement. PMHx: DMII, HTN, hyperlipidemia, motion sickness,     Clinical Impressions Pt evaluated s/p the admission list above. At baseline, pt lives alone, drives, and completes all ADLs/IADLs and functional mobility independently, without use of DME. Upon evaluation, pt was limited by R knee pain, knowledge of  DME, precautions, and compensatory techniques to increase independence in self-care tasks. After pt education, pt completed lower body and upper body dressing tasks with supervision. Pt able to safely perform functional bending and reaching to thread BLE through clothing while seated EOB. Pt completed all functional mobility tasks with up to supervision provided for safety. Pt does not have acute OT needs. Pt son was present and was very supportive. Son will provide assistance as needed once discharged. Recommends pt discharge to home environment once medically cleared with family support.      If plan is discharge home, recommend the following:   A little help with walking and/or transfers;A lot of help with bathing/dressing/bathroom;Assistance with cooking/housework;Assist for transportation;Help with stairs or ramp for entrance     Functional Status Assessment   Patient has had a recent decline in their functional status and demonstrates the ability to make significant improvements in function in a reasonable and predictable amount of time.     Equipment Recommendations   BSC/3in1     Recommendations for Other Services         Precautions/Restrictions   Precautions Precautions: Knee Recall of Precautions/Restrictions: Intact Precaution/Restrictions Comments: Reviewed positioning of RLE to facilitate knee extension. Pt  and son verbalized understanding Restrictions Weight Bearing Restrictions Per Provider Order: Yes RLE Weight Bearing Per Provider Order: Weight bearing as tolerated     Mobility Bed Mobility Overal bed mobility: Needs Assistance Bed Mobility: Supine to Sit, Sit to Supine     Supine to sit: Supervision Sit to supine: Supervision   General bed mobility comments: Pt completed bed mobility without physical assistance. Supervision provided for safety    Transfers Overall transfer level: Needs assistance Equipment used: Rolling walker (2 wheels) Transfers: Sit to/from Stand Sit to Stand: Supervision           General transfer comment: Supervision for safety. No physical assistance. Great carryover of LE and hand placement from PT eval.      Balance Overall balance assessment: Needs assistance Sitting-balance support: No upper extremity supported, Feet supported Sitting balance-Leahy Scale: Good     Standing balance support: No upper extremity supported Standing balance-Leahy Scale: Fair Standing balance comment: completed clothing management while standing unsupported                           ADL either performed or assessed with clinical judgement   ADL Overall ADL's : Needs assistance/impaired Eating/Feeding: Independent   Grooming: Wash/dry hands;Wash/dry face;Oral care;Applying deodorant;Brushing hair;Supervision/safety;Standing   Upper Body Bathing: Supervision/ safety;Sitting   Lower Body Bathing: Supervison/ safety;Sitting/lateral leans   Upper Body Dressing : Supervision/safety;Sitting   Lower Body Dressing: Supervision/safety;Sit to/from stand   Toilet Transfer: Supervision/safety;Ambulation;Regular Toilet;Rolling walker (2 wheels)   Toileting- Clothing Manipulation and Hygiene: Supervision/safety;Sitting/lateral lean;Sit to/from stand   Tub/ Shower Transfer: Supervision/safety;Ambulation;Shower seat;Rolling walker (2 wheels)   Functional  mobility during ADLs: Supervision/safety;Rolling walker (2 wheels) General ADL Comments: Supervision provided for safety.  Pt able to perform functional bending and reaching to thread LB clothing while seated EOB. Clothing management completed while standing unsupported. No LOB observed.     Vision Baseline Vision/History: 1 Wears glasses Ability to See in Adequate Light: 0 Adequate Patient Visual Report: No change from baseline Vision Assessment?: No apparent visual deficits     Perception Perception: Within Functional Limits       Praxis Praxis: Not tested       Pertinent Vitals/Pain Pain Assessment Pain Assessment: Faces Faces Pain Scale: Hurts little more Pain Location: R knee, headache Pain Descriptors / Indicators: Aching, Operative site guarding, Headache Pain Intervention(s): Monitored during session     Extremity/Trunk Assessment Upper Extremity Assessment Upper Extremity Assessment: Overall WFL for tasks assessed   Lower Extremity Assessment Lower Extremity Assessment: Defer to PT evaluation RLE Deficits / Details: Post op guarding Rt knee as expected.  SLR without lag. Passive extension near full, seated EOB, flexion close to 90 with self applied overpressure. RLE: Unable to fully assess due to pain   Cervical / Trunk Assessment Cervical / Trunk Assessment: Normal   Communication Communication Communication: No apparent difficulties   Cognition Arousal: Alert Behavior During Therapy: WFL for tasks assessed/performed Cognition: No apparent impairments             OT - Cognition Comments: Pt answered questions appropriately, able to follow multi-step commands, and recalled positioning of RLE.                 Following commands: Intact       Cueing  General Comments   Cueing Techniques: Verbal cues      Exercises     Shoulder Instructions      Home Living Family/patient expects to be discharged to:: Private residence Living  Arrangements: Alone Available Help at Discharge: Family;Available 24 hours/day Type of Home: House Home Access: Level entry     Home Layout: Two level;Able to live on main level with bedroom/bathroom Alternate Level Stairs-Number of Steps: flight   Bathroom Shower/Tub: Producer, television/film/video: Standard Bathroom Accessibility: Yes How Accessible: Accessible via walker Home Equipment: Agricultural consultant (2 wheels)   Additional Comments: Pt educated on use of 3-in-1 to use in shower when showers are permissible by Dr. Tessa Lerner ordered for pt      Prior Functioning/Environment Prior Level of Function : Independent/Modified Independent;Driving             Mobility Comments: ind no AD ADLs Comments: ind, driving    OT Problem List: Decreased activity tolerance;Impaired balance (sitting and/or standing);Decreased knowledge of use of DME or AE;Decreased knowledge of precautions;Pain;Decreased strength   OT Treatment/Interventions:        OT Goals(Current goals can be found in the care plan section)   Acute Rehab OT Goals Patient Stated Goal: to get better OT Goal Formulation: With patient Time For Goal Achievement: 11/15/23 Potential to Achieve Goals: Good   OT Frequency:       Co-evaluation              AM-PAC OT "6 Clicks" Daily Activity     Outcome Measure Help from another person eating meals?: None Help from another person taking care of personal grooming?: A Little Help from another person toileting, which includes using toliet, bedpan, or urinal?: A Little Help from another person bathing (including washing, rinsing, drying)?: A Little Help from another person to put on and taking off regular upper body clothing?: A Little Help from another  person to put on and taking off regular lower body clothing?: A Little 6 Click Score: 19   End of Session Equipment Utilized During Treatment: Rolling walker (2 wheels);Gait belt Nurse Communication: Mobility  status  Activity Tolerance: Patient tolerated treatment well Patient left: in bed;with call bell/phone within reach;with family/visitor present  OT Visit Diagnosis: Other abnormalities of gait and mobility (R26.89);Muscle weakness (generalized) (M62.81);Pain;Unsteadiness on feet (R26.81) Pain - Right/Left: Right Pain - part of body: Knee                Time: 6440-3474 OT Time Calculation (min): 16 min Charges:  OT General Charges $OT Visit: 1 Visit OT Evaluation $OT Eval Low Complexity: 1 Low  Kevan Ny, MOTS\ Devyon Keator 11/15/2023, 10:41 AM

## 2023-11-21 ENCOUNTER — Other Ambulatory Visit: Payer: Self-pay | Admitting: Physician Assistant

## 2023-11-21 ENCOUNTER — Telehealth: Payer: Self-pay | Admitting: Orthopaedic Surgery

## 2023-11-21 MED ORDER — OXYCODONE-ACETAMINOPHEN 5-325 MG PO TABS: 1.0000 | ORAL_TABLET | Freq: Three times a day (TID) | ORAL | 0 refills | Status: DC | PRN

## 2023-11-21 MED ORDER — OXYCODONE-ACETAMINOPHEN 5-325 MG PO TABS
1.0000 | ORAL_TABLET | Freq: Three times a day (TID) | ORAL | 0 refills | Status: DC | PRN
Start: 2023-11-21 — End: 2023-11-21

## 2023-11-21 NOTE — Telephone Encounter (Signed)
 Pt's daughter [facility] called requesting refill of oxycodone. Please send to pharmacy on file. Pt phone number is [phone].

## 2023-11-21 NOTE — Telephone Encounter (Signed)
Notified patient's daughter

## 2023-11-21 NOTE — Telephone Encounter (Signed)
 Just sent

## 2023-11-25 ENCOUNTER — Ambulatory Visit: Payer: Medicare Other | Admitting: Orthopaedic Surgery

## 2023-11-25 ENCOUNTER — Encounter: Payer: Medicare Other | Admitting: Physician Assistant

## 2023-11-25 DIAGNOSIS — Z96651 Presence of right artificial knee joint: Secondary | ICD-10-CM

## 2023-11-25 MED ORDER — METHYLPREDNISOLONE 4 MG PO TBPK: ORAL_TABLET | ORAL | 0 refills | Status: DC

## 2023-11-25 MED ORDER — METHOCARBAMOL 750 MG PO TABS: 750.0000 mg | ORAL_TABLET | Freq: Three times a day (TID) | ORAL | 2 refills | Status: DC | PRN

## 2023-11-25 MED ORDER — OXYCODONE-ACETAMINOPHEN 5-325 MG PO TABS
1.0000 | ORAL_TABLET | Freq: Three times a day (TID) | ORAL | 0 refills | Status: DC | PRN
Start: 2023-11-25 — End: 2023-12-01

## 2023-11-25 NOTE — Addendum Note (Signed)
 Addended by: Cristie Hem on: 11/25/2023 09:59 AM   Modules accepted: Orders

## 2023-11-25 NOTE — Progress Notes (Signed)
 Post-Op Visit Note   Patient: Lisa Fletcher           Date of Birth: 1946-01-14           MRN: [id number] Visit Date: 11/25/2023 PCP: Lauro Regulus, MD   Assessment & Plan:  Chief Complaint:  Chief Complaint  Patient presents with   Right Knee - Routine Post Op   Visit Diagnoses:  1. Status post total right knee replacement     Plan: Patient is a pleasant 78 year old female who comes in today 11 days status post right total knee replacement 11/14/2023.  She has been doing okay but has had increased nerve pain to the right buttock down the back of the right leg.  She is taking Robaxin and oxycodone for pain.  She has been compliant taking her Xarelto for DVT prophylaxis.  She is getting home health physical therapy and is ambulating with a walker.  Examination of her right knee reveals a well-healing surgical incision with nylon sutures in place.  No evidence of infection or cellulitis.  Calves are soft nontender.  She is neurovascularly intact distally.  At this point, it is too early to remove her sutures.  We have rebandaged her incision.  She will follow-up next week for suture removal.  We will extend her home health physical therapy for another 2 weeks as her son has to return back to [place], how ever I will go ahead and place an outpatient physical therapy referral for when home health is finished.  She will continue her Xarelto until she is 4 weeks postop and then transition to a baby aspirin twice daily for 2 weeks before transitioning to her regular aspirin dose.  I have discussed sending her in a Medrol Dosepak as well.  She will closely monitor her blood sugar over the next 1 to 2 weeks.  Call with concerns or questions in the meantime.  Follow-Up Instructions: Return in about 1 week (around 12/02/2023).   Orders:  No orders of the defined types were placed in this encounter.  No orders of the defined types were placed in this encounter.   Imaging: No new  imaging  PMFS History: Patient Active Problem List   Diagnosis Date Noted   Status post total right knee replacement 11/14/2023   Primary osteoarthritis of left knee 07/27/2023   Primary osteoarthritis of right knee 07/27/2023   Varicose veins of bilateral lower extremities with pain 12/23/2017   Uncontrolled type 2 diabetes mellitus with hyperglycemia, with long-term current use of insulin (HCC) 06/30/2016   DM II (diabetes mellitus, type II), controlled (HCC) 03/24/2015   Essential hypertension with goal blood pressure less than 140/90 03/24/2015   Pure hypercholesterolemia 03/24/2015   Past Medical History:  Diagnosis Date   Arthritis    Diabetes mellitus, type 2 (HCC)    Hyperlipidemia    Hypertension    Motion sickness    car(back seat) and circular motion   Wears dentures    partial upper    Family History  Problem Relation Age of Onset   Cancer Sister    Diabetes Sister    Hypertension Sister    Breast cancer Sister    Deep vein thrombosis Brother    Diabetes Brother    Hypertension Brother     Past Surgical History:  Procedure Laterality Date   ABDOMINAL HYSTERECTOMY     1970s   CARPAL TUNNEL RELEASE Left 04/20/2017   Procedure: CARPAL TUNNEL RELEASE ENDOSCOPIC;  Surgeon: Joice Lofts,  Excell Seltzer, MD;  Location: [facility];  Service: Orthopedics;  Laterality: Left;  Diabetic - insulin   CARPAL TUNNEL RELEASE Right 06/08/2017   Procedure: CARPAL TUNNEL RELEASE ENDOSCOPIC;  Surgeon: Christena Flake, MD;  Location: [facility];  Service: Orthopedics;  Laterality: Right;   CATARACT EXTRACTION W/PHACO Right 07/10/2019   Procedure: CATARACT EXTRACTION PHACO AND INTRAOCULAR LENS PLACEMENT (IOC) RIGHT DIABETIC  01:04.9  15.3%  9.97;  Surgeon: Galen Manila, MD;  Location: [facility];  Service: Ophthalmology;  Laterality: Right;  diabetic - insulin   CATARACT EXTRACTION W/PHACO Left 12/02/2020   Procedure: CATARACT EXTRACTION PHACO AND INTRAOCULAR LENS  PLACEMENT (IOC) LEFT DIABETIC 4.85 00:39.9;  Surgeon: Galen Manila, MD;  Location: [facility];  Service: Ophthalmology;  Laterality: Left;  Diabetic - insulin   COLONOSCOPY  2024   TOTAL KNEE ARTHROPLASTY Right 11/14/2023   Procedure: RIGHT TOTAL KNEE ARTHROPLASTY;  Surgeon: Tarry Kos, MD;  Location: [facility];  Service: Orthopedics;  Laterality: Right;   Social History   Occupational History   Not on file  Tobacco Use   Smoking status: Never   Smokeless tobacco: Never  Vaping Use   Vaping status: Never Used  Substance and Sexual Activity   Alcohol use: No   Drug use: Never   Sexual activity: Not Currently

## 2023-11-30 ENCOUNTER — Encounter: Payer: Medicare Other | Admitting: Physician Assistant

## 2023-11-30 NOTE — Progress Notes (Unsigned)
   Post-Op Visit Note   Patient: Lisa Fletcher           Date of Birth: Oct 04, 1945           MRN: [id number] Visit Date: 12/01/2023 PCP: Lauro Regulus, MD   Assessment & Plan:  Chief Complaint: No chief complaint on file. Visit Diagnoses: No diagnosis found.  Plan: ***  Follow-Up Instructions: No follow-ups on file.   Orders:  No orders of the defined types were placed in this encounter. No orders of the defined types were placed in this encounter.  Imaging: No results found.  PMFS History: Patient Active Problem List   Diagnosis Date Noted  . Status post total right knee replacement 11/14/2023  . Primary osteoarthritis of left knee 07/27/2023  . Primary osteoarthritis of right knee 07/27/2023  . Varicose veins of bilateral lower extremities with pain 12/23/2017  . Uncontrolled type 2 diabetes mellitus with hyperglycemia, with long-term current use of insulin (HCC) 06/30/2016  . DM II (diabetes mellitus, type II), controlled (HCC) 03/24/2015  . Essential hypertension with goal blood pressure less than 140/90 03/24/2015  . Pure hypercholesterolemia 03/24/2015   Past Medical History:  Diagnosis Date  . Arthritis   . Diabetes mellitus, type 2 (HCC)   . Hyperlipidemia   . Hypertension   . Motion sickness    car(back seat) and circular motion  . Wears dentures    partial upper    Family History  Problem Relation Age of Onset  . Cancer Sister   . Diabetes Sister   . Hypertension Sister   . Breast cancer Sister   . Deep vein thrombosis Brother   . Diabetes Brother   . Hypertension Brother     Past Surgical History:  Procedure Laterality Date  . ABDOMINAL HYSTERECTOMY     1970s  . CARPAL TUNNEL RELEASE Left 04/20/2017   Procedure: CARPAL TUNNEL RELEASE ENDOSCOPIC;  Surgeon: Christena Flake, MD;  Location: [facility];  Service: Orthopedics;  Laterality: Left;  Diabetic - insulin  . CARPAL TUNNEL RELEASE Right 06/08/2017   Procedure: CARPAL TUNNEL  RELEASE ENDOSCOPIC;  Surgeon: Christena Flake, MD;  Location: [facility];  Service: Orthopedics;  Laterality: Right;  . CATARACT EXTRACTION W/PHACO Right 07/10/2019   Procedure: CATARACT EXTRACTION PHACO AND INTRAOCULAR LENS PLACEMENT (IOC) RIGHT DIABETIC  01:04.9  15.3%  9.97;  Surgeon: Galen Manila, MD;  Location: [facility];  Service: Ophthalmology;  Laterality: Right;  diabetic - insulin  . CATARACT EXTRACTION W/PHACO Left 12/02/2020   Procedure: CATARACT EXTRACTION PHACO AND INTRAOCULAR LENS PLACEMENT (IOC) LEFT DIABETIC 4.85 00:39.9;  Surgeon: Galen Manila, MD;  Location: [facility];  Service: Ophthalmology;  Laterality: Left;  Diabetic - insulin  . COLONOSCOPY  2024  . TOTAL KNEE ARTHROPLASTY Right 11/14/2023   Procedure: RIGHT TOTAL KNEE ARTHROPLASTY;  Surgeon: Tarry Kos, MD;  Location: [facility];  Service: Orthopedics;  Laterality: Right;   Social History   Occupational History  . Not on file  Tobacco Use  . Smoking status: Never  . Smokeless tobacco: Never  Vaping Use  . Vaping status: Never Used  Substance and Sexual Activity  . Alcohol use: No  . Drug use: Never  . Sexual activity: Not Currently

## 2023-12-01 ENCOUNTER — Ambulatory Visit (INDEPENDENT_AMBULATORY_CARE_PROVIDER_SITE_OTHER): Admitting: Physician Assistant

## 2023-12-01 ENCOUNTER — Encounter: Payer: Self-pay | Admitting: Physician Assistant

## 2023-12-01 DIAGNOSIS — Z96651 Presence of right artificial knee joint: Secondary | ICD-10-CM

## 2023-12-01 MED ORDER — OXYCODONE-ACETAMINOPHEN 5-325 MG PO TABS: 1.0000 | ORAL_TABLET | Freq: Two times a day (BID) | ORAL | 0 refills | Status: DC | PRN

## 2023-12-15 ENCOUNTER — Ambulatory Visit: Attending: Physician Assistant

## 2023-12-15 DIAGNOSIS — R262 Difficulty in walking, not elsewhere classified: Secondary | ICD-10-CM | POA: Diagnosis present

## 2023-12-15 DIAGNOSIS — M25661 Stiffness of right knee, not elsewhere classified: Secondary | ICD-10-CM | POA: Insufficient documentation

## 2023-12-15 DIAGNOSIS — Z96651 Presence of right artificial knee joint: Secondary | ICD-10-CM | POA: Insufficient documentation

## 2023-12-15 NOTE — Therapy (Signed)
 OUTPATIENT PHYSICAL THERAPY EVALUATION   Patient Name: Lisa Fletcher MRN: [id number] DOB:[birth year], 78 y.o., female Today's Date: 12/15/2023  END OF SESSION:  PT End of Session - 12/15/23 1042     Visit Number 1    Number of Visits 24    Date for PT Re-Evaluation 03/08/24    Authorization Type Medicare c BCBS secondary    Authorization Time Period 12/15/23-03/08/24    Progress Note Due on Visit 10    PT Start Time 1035    PT Stop Time 1115    PT Time Calculation (min) 40 min    Activity Tolerance Patient tolerated treatment well;Patient limited by pain    Behavior During Therapy WFL for tasks assessed/performed             Past Medical History:  Diagnosis Date   Arthritis    Diabetes mellitus, type 2 (HCC)    Hyperlipidemia    Hypertension    Motion sickness    car(back seat) and circular motion   Wears dentures    partial upper   Past Surgical History:  Procedure Laterality Date   ABDOMINAL HYSTERECTOMY     1970s   CARPAL TUNNEL RELEASE Left 04/20/2017   Procedure: CARPAL TUNNEL RELEASE ENDOSCOPIC;  Surgeon: Christena Flake, MD;  Location: [facility];  Service: Orthopedics;  Laterality: Left;  Diabetic - insulin   CARPAL TUNNEL RELEASE Right 06/08/2017   Procedure: CARPAL TUNNEL RELEASE ENDOSCOPIC;  Surgeon: Christena Flake, MD;  Location: [facility];  Service: Orthopedics;  Laterality: Right;   CATARACT EXTRACTION W/PHACO Right 07/10/2019   Procedure: CATARACT EXTRACTION PHACO AND INTRAOCULAR LENS PLACEMENT (IOC) RIGHT DIABETIC  01:04.9  15.3%  9.97;  Surgeon: Galen Manila, MD;  Location: [facility];  Service: Ophthalmology;  Laterality: Right;  diabetic - insulin   CATARACT EXTRACTION W/PHACO Left 12/02/2020   Procedure: CATARACT EXTRACTION PHACO AND INTRAOCULAR LENS PLACEMENT (IOC) LEFT DIABETIC 4.85 00:39.9;  Surgeon: Galen Manila, MD;  Location: [facility];  Service: Ophthalmology;  Laterality: Left;  Diabetic - insulin    COLONOSCOPY  2024   TOTAL KNEE ARTHROPLASTY Right 11/14/2023   Procedure: RIGHT TOTAL KNEE ARTHROPLASTY;  Surgeon: Tarry Kos, MD;  Location: [facility] OR;  Service: Orthopedics;  Laterality: Right;   Patient Active Problem List   Diagnosis Date Noted   Status post total right knee replacement 11/14/2023   Primary osteoarthritis of left knee 07/27/2023   Primary osteoarthritis of right knee 07/27/2023   Varicose veins of bilateral lower extremities with pain 12/23/2017   Uncontrolled type 2 diabetes mellitus with hyperglycemia, with long-term current use of insulin (HCC) 06/30/2016   DM II (diabetes mellitus, type II), controlled (HCC) 03/24/2015   Essential hypertension with goal blood pressure less than 140/90 03/24/2015   Pure hypercholesterolemia 03/24/2015    PCP: Lauro Regulus, MD REFERRING PROVIDER: Jari Sportsman, PA-C  REFERRING DIAG: s/p Rt TKA  THERAPY DIAG:  Stiffness of right knee, not elsewhere classified  Difficulty in walking, not elsewhere classified  Rationale for Evaluation and Treatment: Rehabilitation ONSET DATE: 11/14/23  SUBJECTIVE:  SUBJECTIVE STATEMENT: Pt has a TKA in Feb, has been working with PT at home since then due to difficulty getting out of the home.   PERTINENT HISTORY: History of pain in Rt knee, followed by orthopedics. No prior use of DME for mobility. Pt underwent TKE on 11/14/23 with Dr. Roda Shutters, Son came down from [place] to provide assistance for period. Now is getting intermittent  assistance from her cousin (including driving assist), has been working with home health services for 5 weeks due to difficulty getting out of house. Pt reports getting to 92 degrees with home health and working on independent HEP 1-2x daily. Pt uses RW for most walking needs in and out of house, but comes to evaluation with [facility] for simplicity and convenience.  PAIN:  Are you having pain? 0/10 (stiffness is primary complaint, albeit there is pain recoil with flexion  ranging)   PRECAUTIONS: Knee WEIGHT BEARING RESTRICTIONS: Yes WBAT FALLS:  Has patient fallen in last 6 months? No  LIVING ENVIRONMENT: Lives with: Alone  Lives in: townhouse; lives on main level  Stairs: 1 step  Has following equipment at home: RW, SPC, BSC, shower seat; CPM not picked up yet,   OCCUPATION: retired   PLOF: fully independent   PATIENT GOALS: return to ad lib walking and have full ROM in knee  NEXT MD VISIT:   OBJECTIVE:  Note: Objective measures were completed at Evaluation unless otherwise noted.  PATIENT SURVEYS:  LEFS: 88.75%  EDEMA:  Typical for post surgical timeline.   LOWER EXTREMITY ROM:  Active ROM Right eval Left eval  Knee flexion 16*   Knee extension 88*    (Blank rows = not tested)  FUNCTIONAL TESTS:  -FTSTS: 19.26sec hands free form 21 inches (unable to rise hands free from lower surface), excellent symmetry. -540ft AMB c 4WW overground: 3 minutes 53 seconds; reports to feel wonderful, no significant pain, excellent symmetry of step; 0.87m/s average  GAIT: Distance walked: 526ft as cued Assistive device utilized: Walker - 4 wheeled Level of assistance: Modified independence Comments: has been walking outside on the street quite a bit with her walker and reports a generally favorable response.                                                                                                                              TREATMENT DATE 12/15/23 -seated RLE heel slides AA/ROM for knee extension and knee flexion stretching 1x15x2sec each way *measures 88 degrees flexion at end -5xSTS from 21", arms crossed 19.26sec  -seated from 21", RLE LAQ 10x3secH  -571ft AMB assessment for quality, speed, pain response, device safety (see above)  -Rt knee flexion stretch AA/ROM on staircase: 10x1-2sec, foot on 2nd step  *achieves >95 degrees at peak  -standing Rt knee flexion A/ROM hamstrings curl 1x10  PATIENT EDUCATION:  Education details:   1.  Need to increase volume/frequency of exercises performed   2. Importance of dedicated time to uncomfortable ROM intervention at home  3. Encouraged to continue to walking regimen as tolerated  4. Pt is doing a remarkable job with achieving symmetry of stance and footing in transfers and  AMB.  Person educated: Patient Education method: collaborative learning, deliberate practice, positive reinforcement, explicit instruction, establish rules. Education comprehension: affirmative  HOME EXERCISE PROGRAM: Access Code: [email or web address] URL: [email or web address] Date: 12/15/2023 Prepared by: Alvera Novel  Exercises - Seated Knee Flexion Extension AROM   - 3-5 x daily - 1 sets - 15-20 reps - 2-3sec hold - Seated Long Arc Quad  - 3 x daily - 1 sets - 10 reps - 1sec hold - Standing Knee Flexion Stretch on Step  - 1 x daily - 1 sets - 10 reps - 1-2sec hold - Standing Knee Flexion with Counter Support  - 3 x daily - 1 sets - 10 reps  ASSESSMENT: CLINICAL IMPRESSION: Tani Virgo is a 77yoF who presents for evaluation 5 weeks s/p Rt TKA. Exam revealing of impaired gait speed, need for device use for mobility, impaired joint ROM, reduced activity tolerance, difficulty walking, all within the anticipated scope of typical post surgical timeline. Pt is ahead of anticipated prognosis regarding AMB tolerance, however has not yet tried to ween from RW use. Transfers remain more limited than expected, and ROM is essentially typical for this stage if not just ever so slightly less than anticipated. Pt is incredibly insightful and motivated and is excited to begin the next phase of her rehab. Patient will benefit from skilled physical therapy intervention to reduce deficits and impairments identified in evaluation, in order to reduce pain, improve quality of life, and maximize activity tolerance for ADL, IADL, and leisure/fitness. Physical therapy will help pt achieve long and short term goals of care.     OBJECTIVE IMPAIRMENTS: Abnormal gait, decreased activity tolerance, decreased balance, decreased knowledge of condition, decreased knowledge of use of DME, decreased mobility, difficulty walking, decreased ROM, decreased strength, increased edema, increased muscle spasms, impaired flexibility, improper body mechanics, and postural dysfunction.   ACTIVITY LIMITATIONS: carrying, lifting, bending, sitting, standing, squatting, stairs, transfers, bathing, and locomotion level  PARTICIPATION LIMITATIONS: meal prep, cleaning, laundry, driving, community activity, and yard work  PERSONAL FACTORS: Age, Education, Fitness, Past/current experiences, and Time since onset of injury/illness/exacerbation are also affecting patient's functional outcome.   REHAB POTENTIAL: Excellent  CLINICAL DECISION MAKING: Stable/uncomplicated  EVALUATION COMPLEXITY: Moderate   GOALS: Goals reviewed with patient? No  SHORT TERM GOALS: Target date: 01/15/24 LEFS score >75 Baseline: 71 on 12/15/23 Goal status: INITIAL  2.  Rt knee ROM <11->105 degrees flexion.  Baseline: 16-88 degrees  Goal status: INITIAL  3.  FTSTS hands free from <19" inches in <16sec.  Baseline: 12/15/23: 19.26sec from 21"  Goal status: INITIAL  LONG TERM GOALS: Target date: 02/14/24  FTSTS <12sec from 17"chair height hands free Baseline:  Goal status: INITIAL  2.  >500 meters without antalgic gait, LRAD, no increase in pain >2 on NPRS Baseline:  Goal status: INITIAL  3.  Consistent and confident performance of advanced home HEP for DC.  Baseline:  Goal status: INITIAL  4.  SLS balance > 15sec bilat  Baseline: none  Goal status: INITIAL  5.  TUG<12sec Baseline: none Goal status: INITIAL  6.  Rt knee ROM <10 degrees extension and >122 degrees flexion.  Baseline:  Goal status: INITIAL   PLAN:  PT FREQUENCY: 1-2x/week  PT DURATION: 12 weeks  PLANNED INTERVENTIONS: 97110-Therapeutic exercises, 97530-  Therapeutic activity, 97112- Neuromuscular re-education, [phone]- Self Care, 02725- Manual therapy, [phone]- Gait training, [phone]- Electrical stimulation (unattended), [phone]- Electrical stimulation (manual), Patient/Family education, Balance training, Stair training, Taping, Dry Needling, Joint mobilization, Joint manipulation, Wheelchair mobility training, Cryotherapy, and Moist heat  PLAN FOR NEXT SESSION: Continue with moderate ROM interventions, low to moderate knee loading interventions, gait training; initiate proprioceptive motor control training as indicated.   4:01 PM, 12/15/23 Isaias Cowman  Belva Agee, PT, DPT Physical Therapist - [place] [phone] (Office)     Fontana C, PT 12/15/2023, 10:46 AM

## 2023-12-19 NOTE — Therapy (Addendum)
 OUTPATIENT PHYSICAL THERAPY TREATMENT   Patient Name: Lisa Fletcher MRN: [id number] DOB:December 05, 1945, 78 y.o., female Today's Date: 12/20/2023  END OF SESSION:  PT End of Session - 12/20/23 1049     Visit Number 2    Number of Visits 24    Date for PT Re-Evaluation 03/08/24    Authorization Type Medicare c BCBS secondary    Authorization Time Period 12/15/23-03/08/24    Progress Note Due on Visit 10    PT Start Time 1040    PT Stop Time 1120    PT Time Calculation (min) 40 min    Activity Tolerance Patient tolerated treatment well;Patient limited by pain    Behavior During Therapy WFL for tasks assessed/performed              Past Medical History:  Diagnosis Date   Arthritis    Diabetes mellitus, type 2 (HCC)    Hyperlipidemia    Hypertension    Motion sickness    car(back seat) and circular motion   Wears dentures    partial upper   Past Surgical History:  Procedure Laterality Date   ABDOMINAL HYSTERECTOMY     1970s   CARPAL TUNNEL RELEASE Left 04/20/2017   Procedure: CARPAL TUNNEL RELEASE ENDOSCOPIC;  Surgeon: Christena Flake, MD;  Location: [facility];  Service: Orthopedics;  Laterality: Left;  Diabetic - insulin   CARPAL TUNNEL RELEASE Right 06/08/2017   Procedure: CARPAL TUNNEL RELEASE ENDOSCOPIC;  Surgeon: Christena Flake, MD;  Location: [facility];  Service: Orthopedics;  Laterality: Right;   CATARACT EXTRACTION W/PHACO Right 07/10/2019   Procedure: CATARACT EXTRACTION PHACO AND INTRAOCULAR LENS PLACEMENT (IOC) RIGHT DIABETIC  01:04.9  15.3%  9.97;  Surgeon: Galen Manila, MD;  Location: [facility];  Service: Ophthalmology;  Laterality: Right;  diabetic - insulin   CATARACT EXTRACTION W/PHACO Left 12/02/2020   Procedure: CATARACT EXTRACTION PHACO AND INTRAOCULAR LENS PLACEMENT (IOC) LEFT DIABETIC 4.85 00:39.9;  Surgeon: Galen Manila, MD;  Location: [facility];  Service: Ophthalmology;  Laterality: Left;  Diabetic -  insulin   COLONOSCOPY  2024   TOTAL KNEE ARTHROPLASTY Right 11/14/2023   Procedure: RIGHT TOTAL KNEE ARTHROPLASTY;  Surgeon: Tarry Kos, MD;  Location: [facility] OR;  Service: Orthopedics;  Laterality: Right;   Patient Active Problem List   Diagnosis Date Noted   Status post total right knee replacement 11/14/2023   Primary osteoarthritis of left knee 07/27/2023   Primary osteoarthritis of right knee 07/27/2023   Varicose veins of bilateral lower extremities with pain 12/23/2017   Uncontrolled type 2 diabetes mellitus with hyperglycemia, with long-term current use of insulin (HCC) 06/30/2016   DM II (diabetes mellitus, type II), controlled (HCC) 03/24/2015   Essential hypertension with goal blood pressure less than 140/90 03/24/2015   Pure hypercholesterolemia 03/24/2015    PCP: Lauro Regulus, MD REFERRING PROVIDER: Jari Sportsman, PA-C  REFERRING DIAG: s/p Rt TKA  THERAPY DIAG:  Stiffness of right knee, not elsewhere classified  Difficulty in walking, not elsewhere classified  Rationale for Evaluation and Treatment: Rehabilitation ONSET DATE: 11/14/23  SUBJECTIVE:  PERTINENT HISTORY: History of pain in Rt knee, followed by orthopedics. No prior use of DME for mobility. Pt underwent TKE on 11/14/23 with Dr. Roda Shutters, Son came down from [place] to provide assistance for period. Now is getting intermittent assistance from her cousin (including driving assist), has been working with home health services for 5 weeks due to difficulty getting out of house. Pt reports  getting to 92 degrees with home health and working on independent HEP 1-2x daily. Pt uses RW for most walking needs in and out of house, but comes to evaluation with [facility] for simplicity and convenience.   Exercise history: [facility] doing knee exercises and something with her arms. She is planning to go back.   SUBJECTIVE STATEMENT: Patient states she is doing well with no pain upon arrival. She  complains just of stiffness. She states she is doing better with being able to lift her leg up an her HEP is going well.   PAIN:  Are you having pain? 0/10 (stiffness is primary complaint, albeit there is pain recoil with flexion ranging)   PRECAUTIONS: Knee WEIGHT BEARING RESTRICTIONS: Yes WBAT   PATIENT GOALS: return to ad lib walking and have full ROM in knee  NEXT MD VISIT:   OBJECTIVE:   Vitals:   12/20/23 1119  BP: 112/62  Pulse: 98  SpO2: 100%  Manual  SELF-REPORTED FUNCTION Patient Specific Functional Scale (PSFS)  Bending my knee: 8 Standing up without grabbing a hold of something: 0 Raising my leg up straight: 8 Average: 5.3/10                                                                                                                               TREATMENT  Therapeutic exercise: therapeutic exercises that incorporate ONE parameter at one or more areas of the body to centralize symptoms, develop strength and endurance, range of motion, and flexibility required for successful completion of functional activities.  Vitals measurement for systems review (see above)  NuStep level 1 using bilateral upper and lower extremities. Seat/handle setting 10/11. For improved extremity mobility, muscular endurance, and activity tolerance; and to induce the analgesic effect of aerobic exercise, stimulate improved joint nutrition, and prepare body structures and systems for following interventions. x 8  minutes. Average SPM = 77.  Seated AROM R knee flexion 1x15 with 3 second hold  Seated AAROM R knee flexion with overpressure from contralateral LE 1x15 Added to HEP  Seated LAQ AROM 3x10  Seated quad set with heel on floor 1x20 with 5 second hold Added to HEP  Standing AAROM R knee flexion on stairs with B UE support 2x15 with 5 second holds Foot on second step  Therapeutic activities: dynamic therapeutic activities incorporating MULTIPLE parameters or areas of the  body designed to achieve improved functional performance.  Standing sink squats 2x10 depth to tolerance Mirror feedback to help correct left shift  Pt required multimodal cuing for proper technique and to facilitate improved neuromuscular control, strength, range of motion, and functional ability resulting in improved performance and form.    PATIENT EDUCATION:  Education details: Exercise purpose/form. Self management techniques.  Person educated: Patient Education method: Explanation, Demonstration, and Tactile cues Education comprehension: verbalized understanding, returned demonstration, and needs further education  HOME EXERCISE PROGRAM: Access Code: [email or web address] URL:  [email or web address] Date: 12/20/2023 Prepared by: Norton Blizzard  Exercises - Seated Knee Flexion AAROM  - 3-5 x daily - 1 sets - 15-20 reps - 2-3 seconds hold - Standing Knee Flexion Stretch on Step  - 3 x daily - 1 sets - 15 reps - 1-2sec hold - Seated Quad Set  - 3-5 x daily - 1 sets - 20 reps - 5 second hold hold - Seated Long Arc Quad  - 3 x daily - 1 sets - 10 reps - 1sec hold - Squat with Counter Support  - 1 x daily - 2 sets - 10 reps  ASSESSMENT: CLINICAL IMPRESSION: Patient arrives with good pain control and improving quad activation. Today's session focused on continuing gentle progressive ROM and strengthening as tolerated. Patient tolerated session well and needed cuing to improve her form. Would benefit from gait training next session. Patient would benefit from continued management of limiting condition by skilled physical therapist to address remaining impairments and functional limitations to work towards stated goals and return to PLOF or maximal functional independence.   From initial PT evaluation on 12/15/2023:  Ravenne Wayment is a 77yoF who presents for evaluation 5 weeks s/p Rt TKA. Exam revealing of impaired gait speed, need for device use for mobility, impaired joint ROM, reduced  activity tolerance, difficulty walking, all within the anticipated scope of typical post surgical timeline. Pt is ahead of anticipated prognosis regarding AMB tolerance, however has not yet tried to ween from RW use. Transfers remain more limited than expected, and ROM is essentially typical for this stage if not just ever so slightly less than anticipated. Pt is incredibly insightful and motivated and is excited to begin the next phase of her rehab. Patient will benefit from skilled physical therapy intervention to reduce deficits and impairments identified in evaluation, in order to reduce pain, improve quality of life, and maximize activity tolerance for ADL, IADL, and leisure/fitness. Physical therapy will help pt achieve long and short term goals of care.    OBJECTIVE IMPAIRMENTS: Abnormal gait, decreased activity tolerance, decreased balance, decreased knowledge of condition, decreased knowledge of use of DME, decreased mobility, difficulty walking, decreased ROM, decreased strength, increased edema, increased muscle spasms, impaired flexibility, improper body mechanics, and postural dysfunction.   ACTIVITY LIMITATIONS: carrying, lifting, bending, sitting, standing, squatting, stairs, transfers, bathing, and locomotion level  PARTICIPATION LIMITATIONS: meal prep, cleaning, laundry, driving, community activity, and yard work  PERSONAL FACTORS: Age, Education, Fitness, Past/current experiences, and Time since onset of injury/illness/exacerbation are also affecting patient's functional outcome.   REHAB POTENTIAL: Excellent  CLINICAL DECISION MAKING: Stable/uncomplicated  EVALUATION COMPLEXITY: Moderate   GOALS: Goals reviewed with patient? No  SHORT TERM GOALS: Target date: 01/15/24 LEFS score >75 Baseline: 71 on 12/15/23 Goal status: In progress  2.  Rt knee ROM <11->105 degrees flexion.  Baseline: 16-88 degrees  Goal status: In progress  3.  FTSTS hands free from <19" inches in  <16sec.  Baseline: 12/15/23: 19.26sec from 21"  Goal status: In progress  LONG TERM GOALS: Target date: 02/14/24  FTSTS <12sec from 17"chair height hands free Baseline:  Goal status: In progress  2.  >500 meters without antalgic gait, LRAD, no increase in pain >2 on NPRS Baseline:  Goal status: In progress  3.  Consistent and confident performance of advanced home HEP for DC.  Baseline:  Goal status: In progress  4.  SLS balance > 15sec bilat  Baseline: none  Goal status: In progress  5.  TUG<12sec Baseline:  none Goal status: In progress  6.  Rt knee ROM <10 degrees extension and >122 degrees flexion.  Baseline:  Goal status: In progress   PLAN:  PT FREQUENCY: 1-2x/week  PT DURATION: 12 weeks  PLANNED INTERVENTIONS: 97110-Therapeutic exercises, 97530- Therapeutic activity, 97112- Neuromuscular re-education, [phone]- Self Care, 96295- Manual therapy, [phone]- Gait training, [phone]- Electrical stimulation (unattended), [phone]- Electrical stimulation (manual), Patient/Family education, Balance training, Stair training, Taping, Dry Needling, Joint mobilization, Joint manipulation, Wheelchair mobility training, Cryotherapy, and Moist heat  PLAN FOR NEXT SESSION: Continue with moderate ROM interventions, low to moderate knee loading interventions, gait training; initiate proprioceptive motor control training as indicated.   Luretha Murphy. Ilsa Iha, PT, DPT 12/20/23, 1:01 PM  [facility] [address] [place], [place] [address] P: [phone] I F: [phone]

## 2023-12-20 ENCOUNTER — Ambulatory Visit: Admitting: Physical Therapy

## 2023-12-20 ENCOUNTER — Encounter: Payer: Self-pay | Admitting: Physical Therapy

## 2023-12-20 VITALS — BP 112/62 | HR 98

## 2023-12-20 DIAGNOSIS — M25661 Stiffness of right knee, not elsewhere classified: Secondary | ICD-10-CM | POA: Diagnosis not present

## 2023-12-20 DIAGNOSIS — R262 Difficulty in walking, not elsewhere classified: Secondary | ICD-10-CM

## 2023-12-26 ENCOUNTER — Ambulatory Visit: Admitting: Physical Therapy

## 2023-12-26 ENCOUNTER — Encounter: Payer: Self-pay | Admitting: Physical Therapy

## 2023-12-26 DIAGNOSIS — M25661 Stiffness of right knee, not elsewhere classified: Secondary | ICD-10-CM

## 2023-12-26 DIAGNOSIS — R262 Difficulty in walking, not elsewhere classified: Secondary | ICD-10-CM

## 2023-12-26 NOTE — Therapy (Signed)
 OUTPATIENT PHYSICAL THERAPY TREATMENT   Patient Name: Lisa Fletcher MRN: [id number] DOB:1945-12-02, 78 y.o., female Today's Date: 12/26/2023  END OF SESSION:  PT End of Session - 12/26/23 1427     Visit Number 3    Number of Visits 24    Date for PT Re-Evaluation 03/08/24    Authorization Type Medicare c BCBS secondary    Authorization Time Period 12/15/23-03/08/24    Progress Note Due on Visit 10    PT Start Time 1352    PT Stop Time 1440    PT Time Calculation (min) 48 min    Activity Tolerance Patient tolerated treatment well;Patient limited by pain    Behavior During Therapy WFL for tasks assessed/performed               Past Medical History:  Diagnosis Date   Arthritis    Diabetes mellitus, type 2 (HCC)    Hyperlipidemia    Hypertension    Motion sickness    car(back seat) and circular motion   Wears dentures    partial upper   Past Surgical History:  Procedure Laterality Date   ABDOMINAL HYSTERECTOMY     1970s   CARPAL TUNNEL RELEASE Left 04/20/2017   Procedure: CARPAL TUNNEL RELEASE ENDOSCOPIC;  Surgeon: Christena Flake, MD;  Location: [facility];  Service: Orthopedics;  Laterality: Left;  Diabetic - insulin   CARPAL TUNNEL RELEASE Right 06/08/2017   Procedure: CARPAL TUNNEL RELEASE ENDOSCOPIC;  Surgeon: Christena Flake, MD;  Location: [facility];  Service: Orthopedics;  Laterality: Right;   CATARACT EXTRACTION W/PHACO Right 07/10/2019   Procedure: CATARACT EXTRACTION PHACO AND INTRAOCULAR LENS PLACEMENT (IOC) RIGHT DIABETIC  01:04.9  15.3%  9.97;  Surgeon: Galen Manila, MD;  Location: [facility];  Service: Ophthalmology;  Laterality: Right;  diabetic - insulin   CATARACT EXTRACTION W/PHACO Left 12/02/2020   Procedure: CATARACT EXTRACTION PHACO AND INTRAOCULAR LENS PLACEMENT (IOC) LEFT DIABETIC 4.85 00:39.9;  Surgeon: Galen Manila, MD;  Location: [facility];  Service: Ophthalmology;  Laterality: Left;  Diabetic -  insulin   COLONOSCOPY  2024   TOTAL KNEE ARTHROPLASTY Right 11/14/2023   Procedure: RIGHT TOTAL KNEE ARTHROPLASTY;  Surgeon: Tarry Kos, MD;  Location: [facility];  Service: Orthopedics;  Laterality: Right;   Patient Active Problem List   Diagnosis Date Noted   Status post total right knee replacement 11/14/2023   Primary osteoarthritis of left knee 07/27/2023   Primary osteoarthritis of right knee 07/27/2023   Varicose veins of bilateral lower extremities with pain 12/23/2017   Uncontrolled type 2 diabetes mellitus with hyperglycemia, with long-term current use of insulin (HCC) 06/30/2016   DM II (diabetes mellitus, type II), controlled (HCC) 03/24/2015   Essential hypertension with goal blood pressure less than 140/90 03/24/2015   Pure hypercholesterolemia 03/24/2015    PCP: Lauro Regulus, MD REFERRING PROVIDER: Jari Sportsman, PA-C  REFERRING DIAG: s/p Rt TKA  THERAPY DIAG:  Stiffness of right knee, not elsewhere classified  Difficulty in walking, not elsewhere classified  Rationale for Evaluation and Treatment: Rehabilitation ONSET DATE: 11/14/23  SUBJECTIVE:  PERTINENT HISTORY: History of pain in Rt knee, followed by orthopedics. No prior use of DME for mobility. Pt underwent TKE on 11/14/23 with Dr. Roda Shutters, Son came down from [place] to provide assistance for period. Now is getting intermittent assistance from her cousin (including driving assist), has been working with home health services for 5 weeks due to difficulty getting out of house. Pt  reports getting to 92 degrees with home health and working on independent HEP 1-2x daily. Pt uses RW for most walking needs in and out of house, but comes to evaluation with [facility] for simplicity and convenience.   Exercise history: [facility] doing knee exercises and something with her arms. She is planning to go back.   SUBJECTIVE STATEMENT: Patient states she only has stiffness. She has been working on  flexing her knee when walking and feels she barely needs her cane.   PAIN:  Are you having pain? 0/10 (stiffness at quad tendon)   PRECAUTIONS: Knee WEIGHT BEARING RESTRICTIONS: Yes WBAT   PATIENT GOALS: return to ad lib walking and have full ROM in knee  NEXT MD VISIT:   OBJECTIVE:                                                                                                                                 TREATMENT    Gait Training: a therapeutic procedure in which patient is instructed in specific activities that will facilitate ambulation and stair climbing with or without an assistive device to improve functional mobility.   Ambulation over ground while carrying cane working on improved R heel strike, knee flexion during swing through, and upright posture. R trendelenburg gait noted.   6 Minutes (832 feet)  Therapeutic exercise: therapeutic exercises that incorporate ONE parameter at one or more areas of the body to centralize symptoms, develop strength and endurance, range of motion, and flexibility required for successful completion of functional activities.  Standing AAROM R knee flexion on stairs with B UE support 2x20 with 5 second holds Foot on second step  Standing hip hike off edge of 2x4 board 1x10 each side 1x15 each side Added to HEP  Therapeutic activities: dynamic therapeutic activities incorporating MULTIPLE parameters or areas of the body designed to achieve improved functional performance.  Step up to 6 inch step with UE support 1x10 each side Added to HEP   Modality: for improved quad activation Seated RUSSIAN NMES estim to R quad, seated at knee extension machine with knee at 60 degrees flexion and stabilized to not move, CC, 10/50 cycle, 50 bps, 50% duty cycle, 5 second ramp, anti-fatigue off. Intensity up to 100 mA.  Skin WFL before and after application. Skin cleaned with soap and water prior to application of 3x5 inch rectangle electrodes over  distal quad and proximal motor point. Visible muscle contraction noted.  Patient did not contribute volitional contraction during delivery of stimulation. 15 minutes. Patient tolerated well. Unattended/intermittent attendance by PT after set up and before removal.  Pt required multimodal cuing for proper technique and to facilitate improved neuromuscular control, strength, range of motion, and functional ability resulting in improved performance and form.    PATIENT EDUCATION:  Education details: Exercise purpose/form. Self management techniques.  Person educated: Patient Education method: Explanation, Demonstration, and Tactile cues Education  comprehension: verbalized understanding, returned demonstration, and needs further education  HOME EXERCISE PROGRAM: Access Code: [email or web address] URL: [email or web address] Date: 12/26/2023 Prepared by: Norton Blizzard  Exercises - Seated Knee Flexion AAROM  - 3-5 x daily - 1 sets - 15-20 reps - 2-3 seconds hold - Standing Knee Flexion Stretch on Step  - 3 x daily - 1 sets - 15 reps - 1-2sec hold - Seated Quad Set  - 3-5 x daily - 1 sets - 20 reps - 5 second hold hold - Seated Long Arc Quad  - 3 x daily - 1 sets - 10 reps - 1sec hold - Squat with Counter Support  - 1 x daily - 2 sets - 10 reps - Hip Hikes off step  - 3-5 x weekly - 2 sets - 10 reps - Forward Step Up with Counter Support  - 3-5 x weekly - 2 sets - 10 reps  ASSESSMENT: CLINICAL IMPRESSION: Patient with improved gait and balance today. Ambulated 832 feet in 6 min without use of SPC which is well below norm of 1545 feet for 63-59 year old women. Patient did appear a bit stiffer in knee flexion today. She also demonstrated trendelenburg gait so hip hiking exercise was added to HEP with good understanding of form in clinic. Russian NMES was used to improve quad activation with good tolerance overall but discomfort at pad sites as expected. Patient would benefit from continued  management of limiting condition by skilled physical therapist to address remaining impairments and functional limitations to work towards stated goals and return to PLOF or maximal functional independence.   From initial PT evaluation on 12/15/2023:  Aarian Cleaver is a 77yoF who presents for evaluation 5 weeks s/p Rt TKA. Exam revealing of impaired gait speed, need for device use for mobility, impaired joint ROM, reduced activity tolerance, difficulty walking, all within the anticipated scope of typical post surgical timeline. Pt is ahead of anticipated prognosis regarding AMB tolerance, however has not yet tried to ween from RW use. Transfers remain more limited than expected, and ROM is essentially typical for this stage if not just ever so slightly less than anticipated. Pt is incredibly insightful and motivated and is excited to begin the next phase of her rehab. Patient will benefit from skilled physical therapy intervention to reduce deficits and impairments identified in evaluation, in order to reduce pain, improve quality of life, and maximize activity tolerance for ADL, IADL, and leisure/fitness. Physical therapy will help pt achieve long and short term goals of care.    OBJECTIVE IMPAIRMENTS: Abnormal gait, decreased activity tolerance, decreased balance, decreased knowledge of condition, decreased knowledge of use of DME, decreased mobility, difficulty walking, decreased ROM, decreased strength, increased edema, increased muscle spasms, impaired flexibility, improper body mechanics, and postural dysfunction.   ACTIVITY LIMITATIONS: carrying, lifting, bending, sitting, standing, squatting, stairs, transfers, bathing, and locomotion level  PARTICIPATION LIMITATIONS: meal prep, cleaning, laundry, driving, community activity, and yard work  PERSONAL FACTORS: Age, Education, Fitness, Past/current experiences, and Time since onset of injury/illness/exacerbation are also affecting patient's  functional outcome.   REHAB POTENTIAL: Excellent  CLINICAL DECISION MAKING: Stable/uncomplicated  EVALUATION COMPLEXITY: Moderate   GOALS: Goals reviewed with patient? No  SHORT TERM GOALS: Target date: 01/15/24 LEFS score >75 Baseline: 71 on 12/15/23 Goal status: In progress  2.  Rt knee ROM <11->105 degrees flexion.  Baseline: 16-88 degrees  Goal status: In progress  3.  FTSTS hands free from <19" inches in <16sec.  Baseline: 12/15/23: 19.26sec from  21"  Goal status: In progress  LONG TERM GOALS: Target date: 02/14/24  FTSTS <12sec from 17"chair height hands free Baseline:  Goal status: In progress  2.  >500 meters without antalgic gait, LRAD, no increase in pain >2 on NPRS Baseline:  Goal status: In progress  3.  Consistent and confident performance of advanced home HEP for DC.  Baseline:  Goal status: In progress  4.  SLS balance > 15sec bilat  Baseline: none  Goal status: In progress  5.  TUG<12sec Baseline: none Goal status: In progress  6.  Rt knee ROM <10 degrees extension and >122 degrees flexion.  Baseline:  Goal status: In progress   PLAN:  PT FREQUENCY: 1-2x/week  PT DURATION: 12 weeks  PLANNED INTERVENTIONS: 97110-Therapeutic exercises, 97530- Therapeutic activity, 97112- Neuromuscular re-education, [phone]- Self Care, 60454- Manual therapy, [phone]- Gait training, [phone]- Electrical stimulation (unattended), [phone]- Electrical stimulation (manual), Patient/Family education, Balance training, Stair training, Taping, Dry Needling, Joint mobilization, Joint manipulation, Wheelchair mobility training, Cryotherapy, and Moist heat  PLAN FOR NEXT SESSION: Continue with moderate ROM interventions, low to moderate knee loading interventions, gait training; initiate proprioceptive motor control training as indicated.   Luretha Murphy. Ilsa Iha, PT, DPT 12/26/23, 2:56 PM  [facility] [address] [place], [place] [address] P:  [phone] I F: [phone]

## 2023-12-28 ENCOUNTER — Ambulatory Visit: Attending: Physician Assistant | Admitting: Physical Therapy

## 2023-12-28 ENCOUNTER — Encounter: Payer: Self-pay | Admitting: Physical Therapy

## 2023-12-28 DIAGNOSIS — M25661 Stiffness of right knee, not elsewhere classified: Secondary | ICD-10-CM | POA: Insufficient documentation

## 2023-12-28 DIAGNOSIS — R262 Difficulty in walking, not elsewhere classified: Secondary | ICD-10-CM | POA: Insufficient documentation

## 2023-12-28 NOTE — Therapy (Signed)
 OUTPATIENT PHYSICAL THERAPY TREATMENT   Patient Name: Lisa Fletcher MRN: [id number] DOB:[birth year], 78 y.o., female Today's Date: 12/28/2023  END OF SESSION:  PT End of Session - 12/28/23 1734     Visit Number 4    Number of Visits 24    Date for PT Re-Evaluation 03/08/24    Authorization Type MEDICARE PART B reporting period from 12/15/2023    Authorization Time Period --    Progress Note Due on Visit 10    PT Start Time 1728    PT Stop Time 1828    PT Time Calculation (min) 60 min    Activity Tolerance Patient tolerated treatment well;Patient limited by pain    Behavior During Therapy WFL for tasks assessed/performed                Past Medical History:  Diagnosis Date   Arthritis    Diabetes mellitus, type 2 (HCC)    Hyperlipidemia    Hypertension    Motion sickness    car(back seat) and circular motion   Wears dentures    partial upper   Past Surgical History:  Procedure Laterality Date   ABDOMINAL HYSTERECTOMY     1970s   CARPAL TUNNEL RELEASE Left 04/20/2017   Procedure: CARPAL TUNNEL RELEASE ENDOSCOPIC;  Surgeon: Christena Flake, MD;  Location: [facility];  Service: Orthopedics;  Laterality: Left;  Diabetic - insulin   CARPAL TUNNEL RELEASE Right 06/08/2017   Procedure: CARPAL TUNNEL RELEASE ENDOSCOPIC;  Surgeon: Christena Flake, MD;  Location: [facility];  Service: Orthopedics;  Laterality: Right;   CATARACT EXTRACTION W/PHACO Right 07/10/2019   Procedure: CATARACT EXTRACTION PHACO AND INTRAOCULAR LENS PLACEMENT (IOC) RIGHT DIABETIC  01:04.9  15.3%  9.97;  Surgeon: Galen Manila, MD;  Location: [facility];  Service: Ophthalmology;  Laterality: Right;  diabetic - insulin   CATARACT EXTRACTION W/PHACO Left 12/02/2020   Procedure: CATARACT EXTRACTION PHACO AND INTRAOCULAR LENS PLACEMENT (IOC) LEFT DIABETIC 4.85 00:39.9;  Surgeon: Galen Manila, MD;  Location: [facility];  Service: Ophthalmology;  Laterality: Left;   Diabetic - insulin   COLONOSCOPY  2024   TOTAL KNEE ARTHROPLASTY Right 11/14/2023   Procedure: RIGHT TOTAL KNEE ARTHROPLASTY;  Surgeon: Tarry Kos, MD;  Location: [facility];  Service: Orthopedics;  Laterality: Right;   Patient Active Problem List   Diagnosis Date Noted   Status post total right knee replacement 11/14/2023   Primary osteoarthritis of left knee 07/27/2023   Primary osteoarthritis of right knee 07/27/2023   Varicose veins of bilateral lower extremities with pain 12/23/2017   Uncontrolled type 2 diabetes mellitus with hyperglycemia, with long-term current use of insulin (HCC) 06/30/2016   DM II (diabetes mellitus, type II), controlled (HCC) 03/24/2015   Essential hypertension with goal blood pressure less than 140/90 03/24/2015   Pure hypercholesterolemia 03/24/2015    PCP: Lauro Regulus, MD REFERRING PROVIDER: Jari Sportsman, PA-C  REFERRING DIAG: s/p Rt TKA  THERAPY DIAG:  Stiffness of right knee, not elsewhere classified  Difficulty in walking, not elsewhere classified  Rationale for Evaluation and Treatment: Rehabilitation ONSET DATE: 11/14/23  SUBJECTIVE:  PERTINENT HISTORY: History of pain in Rt knee, followed by orthopedics. No prior use of DME for mobility. Pt underwent TKE on 11/14/23 with Dr. Roda Shutters, Son came down from [place] to provide assistance for period. Now is getting intermittent assistance from her cousin (including driving assist), has been working with home health services for 5 weeks due to difficulty getting  out of house. Pt reports getting to 92 degrees with home health and working on independent HEP 1-2x daily. Pt uses RW for most walking needs in and out of house, but comes to evaluation with [facility] for simplicity and convenience.   Exercise history: [facility] doing knee exercises and something with her arms. She is planning to go back. She has a pair of 2#DB and 10#DB.   SUBJECTIVE STATEMENT: Patient arrives with  [facility] and states she felt really good after completing the estim last visit. She states her leg doesn't feel tight over the thigh any longer. She states she has been only using her cane sometimes. Patient states her HEP is going home well. She states she is doing really well at the stairs and she is using some weights for her arms.   PAIN:  Are you having pain? 0/10 pain just stiffness  PRECAUTIONS: Knee WEIGHT BEARING RESTRICTIONS: Yes WBAT   PATIENT GOALS: return to ad lib walking and have full ROM in knee  NEXT MD VISIT:   OBJECTIVE:   R knee PROM after manual/ROM interventions:  Extension: -12  Flexion: 98 degrees (closed chain on stair step)  1 RM testing, calculated from <10RM: (last tested 12/28/2023);  Single leg press, seat position 1 R: 23.3# L: 30.8#                                                                                                              TREATMENT   Therapeutic exercise: therapeutic exercises that incorporate ONE parameter at one or more areas of the body to centralize symptoms, develop strength and endurance, range of motion, and flexibility required for successful completion of functional activities.  Recumbent bike, seat position 13. Back and forth in available range of motion with self-overpressure at end range to improve joint ROM.  5 second holds. 5 min total.  R knee flexion after: 90 degrees   (Manual therapy - see below)  Seated AAROM R knee flexion with self assistance from other LE 1x15 with 5 second holds  Standing AAROM R knee flexion on stairs with B UE support 1x20 with 5 second hold Foot on second step   Therapeutic activities: dynamic therapeutic activities incorporating MULTIPLE parameters or areas of the body designed to achieve improved functional performance.  Leg press, seat position 1 B LE: 1x15 at 35# L:  1x7 at 25# (max) 1x20 at 12.5#  R:  1x5 at 20# (max, some reduced ROM) 1x30 at 10# (cued for 20 reps, but pt  self-selected to continue)  Manual therapy: to reduce pain and tissue tension, improve range of motion, neuromodulation, in order to promote improved ability to complete functional activities. HOOKLYING/SUPINE Scar massage over proximal half of incision on R knee with education on how/why to do this at home. (Distal half of incision still has stitch remnants compromising the skin integrity)  R patellar mobilization: grade II-IV with medial glide, lateral glide, lateral tilt, caudal glide.   STM to R quad, triceps  surae with and without "the stick" IASTM  R knee extension physiologic mobilizations, 2x30 seconds huge amplitude, 2x30 seconds grade III-IV. (Measurement taken following this - see above).   R knee flexion physiologic mobilizations with forearm behind knee and hand propped on left knee:  Various amounts of up to 30 seconds of time before giving patient break and bringing patient out of end range flexion.  Utilized contract-relax for 2-3 cycles when pateint having difficulty relaxing quad  Modality: for improved quad activation Seated RUSSIAN NMES estim to R quad, seated at knee extension machine with knee at 60 degrees flexion and stabilized to not move, CC, 10/50 cycle, 50 bps, 50% duty cycle, 5 second ramp, anti-fatigue off. Intensity up to 100 mA.  Skin WFL before and after application. Skin cleaned with soap and water prior to application of 3x5 inch rectangle electrodes over distal quad and proximal motor point. Visible muscle contraction noted.  Patient did not contribute volitional contraction during delivery of stimulation. 15 minutes. Patient tolerated well. Unattended/intermittent attendance by PT after set up and before removal.  Pt required multimodal cuing for proper technique and to facilitate improved neuromuscular control, strength, range of motion, and functional ability resulting in improved performance and form.    PATIENT EDUCATION:  Education details: Exercise  purpose/form. Self management techniques.  Person educated: Patient Education method: Explanation, Demonstration, and Tactile cues Education comprehension: verbalized understanding, returned demonstration, and needs further education  HOME EXERCISE PROGRAM: Access Code: [email or web address] URL: [email or web address] Date: 12/26/2023 Prepared by: Norton Blizzard  Exercises - Seated Knee Flexion AAROM  - 3-5 x daily - 1 sets - 15-20 reps - 2-3 seconds hold - Standing Knee Flexion Stretch on Step  - 3 x daily - 1 sets - 15 reps - 1-2sec hold - Seated Quad Set  - 3-5 x daily - 1 sets - 20 reps - 5 second hold hold - Seated Long Arc Quad  - 3 x daily - 1 sets - 10 reps - 1sec hold - Squat with Counter Support  - 1 x daily - 2 sets - 10 reps - Hip Hikes off step  - 3-5 x weekly - 2 sets - 10 reps - Forward Step Up with Counter Support  - 3-5 x weekly - 2 sets - 10 reps  ASSESSMENT: CLINICAL IMPRESSION: More focus today on improving end range of motion in flexion and extension while continuing quad strengthening. Patient had difficulty relaxing R quad for knee flexion but improved from 90 degrees to 98 degrees by end of session after manual therapy followed by closed chain knee flexion exercise. Patient excited by progress and seemed to have a better feel for how much she needs to push the closed chain knee flexion exercise on the stairs. Repeated [place] estim due to good result from last visit. Patient continues to find it uncomfortable but was determined to not let pain stop her from any intervention today. Plan to adjust interventions as needed based on patient's response over the next few days. She did more loading of knee flexion today and more leg press on the right than PT planned when she continued 10 more reps on her own volition. Patient would benefit from continued management of limiting condition by skilled physical therapist to address remaining impairments and functional limitations to  work towards stated goals and return to PLOF or maximal functional independence.   From initial PT evaluation on 12/15/2023:  Zahrah Sutherlin is a 77yoF who presents for evaluation 5 weeks s/p Rt  TKA. Exam revealing of impaired gait speed, need for device use for mobility, impaired joint ROM, reduced activity tolerance, difficulty walking, all within the anticipated scope of typical post surgical timeline. Pt is ahead of anticipated prognosis regarding AMB tolerance, however has not yet tried to ween from RW use. Transfers remain more limited than expected, and ROM is essentially typical for this stage if not just ever so slightly less than anticipated. Pt is incredibly insightful and motivated and is excited to begin the next phase of her rehab. Patient will benefit from skilled physical therapy intervention to reduce deficits and impairments identified in evaluation, in order to reduce pain, improve quality of life, and maximize activity tolerance for ADL, IADL, and leisure/fitness. Physical therapy will help pt achieve long and short term goals of care.    OBJECTIVE IMPAIRMENTS: Abnormal gait, decreased activity tolerance, decreased balance, decreased knowledge of condition, decreased knowledge of use of DME, decreased mobility, difficulty walking, decreased ROM, decreased strength, increased edema, increased muscle spasms, impaired flexibility, improper body mechanics, and postural dysfunction.   ACTIVITY LIMITATIONS: carrying, lifting, bending, sitting, standing, squatting, stairs, transfers, bathing, and locomotion level  PARTICIPATION LIMITATIONS: meal prep, cleaning, laundry, driving, community activity, and yard work  PERSONAL FACTORS: Age, Education, Fitness, Past/current experiences, and Time since onset of injury/illness/exacerbation are also affecting patient's functional outcome.   REHAB POTENTIAL: Excellent  CLINICAL DECISION MAKING: Stable/uncomplicated  EVALUATION COMPLEXITY:  Moderate   GOALS: Goals reviewed with patient? No  SHORT TERM GOALS: Target date: 01/15/24 LEFS score >75 Baseline: 71 on 12/15/23 Goal status: In progress  2.  Rt knee ROM <11->105 degrees flexion.  Baseline: 16-88 degrees  Goal status: In progress  3.  FTSTS hands free from <19" inches in <16sec.  Baseline: 12/15/23: 19.26sec from 21"  Goal status: In progress  LONG TERM GOALS: Target date: 02/14/24  FTSTS <12sec from 17"chair height hands free Baseline:  Goal status: In progress  2.  >500 meters without antalgic gait, LRAD, no increase in pain >2 on NPRS Baseline:  Goal status: In progress  3.  Consistent and confident performance of advanced home HEP for DC.  Baseline:  Goal status: In progress  4.  SLS balance > 15sec bilat  Baseline: none  Goal status: In progress  5.  TUG<12sec Baseline: none Goal status: In progress  6.  Rt knee ROM <10 degrees extension and >122 degrees flexion.  Baseline:  Goal status: In progress   PLAN:  PT FREQUENCY: 1-2x/week  PT DURATION: 12 weeks  PLANNED INTERVENTIONS: 97110-Therapeutic exercises, 97530- Therapeutic activity, 97112- Neuromuscular re-education, [phone]- Self Care, 19147- Manual therapy, [phone]- Gait training, [phone]- Electrical stimulation (unattended), [phone]- Electrical stimulation (manual), Patient/Family education, Balance training, Stair training, Taping, Dry Needling, Joint mobilization, Joint manipulation, Wheelchair mobility training, Cryotherapy, and Moist heat  PLAN FOR NEXT SESSION: Continue with moderate ROM interventions, low to moderate knee loading interventions, gait training; initiate proprioceptive motor control training as indicated.   Luretha Murphy. Ilsa Iha, PT, DPT 12/28/23, 8:15 PM  [facility] [address] [place], [place] [address] P: [phone] I F: [phone]

## 2023-12-30 ENCOUNTER — Other Ambulatory Visit (INDEPENDENT_AMBULATORY_CARE_PROVIDER_SITE_OTHER): Payer: Self-pay

## 2023-12-30 ENCOUNTER — Ambulatory Visit: Admitting: Orthopaedic Surgery

## 2023-12-30 DIAGNOSIS — Z96651 Presence of right artificial knee joint: Secondary | ICD-10-CM | POA: Diagnosis not present

## 2023-12-30 NOTE — Progress Notes (Signed)
 Post-Op Visit Note   Patient: Lisa Fletcher           Date of Birth: 23-Dec-1945           MRN: [id number] Visit Date: 12/30/2023 PCP: Lauro Regulus, MD   Assessment & Plan:  Chief Complaint:  Chief Complaint  Patient presents with   Right Knee - Pain   Visit Diagnoses:  1. Status post total right knee replacement     Plan: Ah[place]i is 6 weeks status post right total knee arthroplasty.  She feels great overall.  She is using a cane at times.  Feels tightness around the knee.  She is making good progress at PT.  Says that the knee is wonderful.  Examination shows fully healed surgical scar.  Range of motion 5 to 95 degrees.  Collaterals are stable.  Diffuse soft tissue swelling.  No signs of infection.  Implant looks stable on x-rays.  She is really happy with her outcome so far.  She will continue to do PT to improve range of motion.  Recheck in 6 weeks.  Follow-Up Instructions: Return in about 6 weeks (around 02/10/2024) for with lindsey.   Orders:  Orders Placed This Encounter  Procedures   XR Knee 1-2 Views Right   No orders of the defined types were placed in this encounter.   Imaging: XR Knee 1-2 Views Right Result Date: 12/30/2023 X-rays of the right knee demonstrate a stable right total knee replacement in good alignment    PMFS History: Patient Active Problem List   Diagnosis Date Noted   Status post total right knee replacement 11/14/2023   Primary osteoarthritis of left knee 07/27/2023   Primary osteoarthritis of right knee 07/27/2023   Varicose veins of bilateral lower extremities with pain 12/23/2017   Uncontrolled type 2 diabetes mellitus with hyperglycemia, with long-term current use of insulin (HCC) 06/30/2016   DM II (diabetes mellitus, type II), controlled (HCC) 03/24/2015   Essential hypertension with goal blood pressure less than 140/90 03/24/2015   Pure hypercholesterolemia 03/24/2015   Past Medical History:  Diagnosis Date   Arthritis     Diabetes mellitus, type 2 (HCC)    Hyperlipidemia    Hypertension    Motion sickness    car(back seat) and circular motion   Wears dentures    partial upper    Family History  Problem Relation Age of Onset   Cancer Sister    Diabetes Sister    Hypertension Sister    Breast cancer Sister    Deep vein thrombosis Brother    Diabetes Brother    Hypertension Brother     Past Surgical History:  Procedure Laterality Date   ABDOMINAL HYSTERECTOMY     1970s   CARPAL TUNNEL RELEASE Left 04/20/2017   Procedure: CARPAL TUNNEL RELEASE ENDOSCOPIC;  Surgeon: Christena Flake, MD;  Location: [facility];  Service: Orthopedics;  Laterality: Left;  Diabetic - insulin   CARPAL TUNNEL RELEASE Right 06/08/2017   Procedure: CARPAL TUNNEL RELEASE ENDOSCOPIC;  Surgeon: Christena Flake, MD;  Location: [facility];  Service: Orthopedics;  Laterality: Right;   CATARACT EXTRACTION W/PHACO Right 07/10/2019   Procedure: CATARACT EXTRACTION PHACO AND INTRAOCULAR LENS PLACEMENT (IOC) RIGHT DIABETIC  01:04.9  15.3%  9.97;  Surgeon: Galen Manila, MD;  Location: [facility];  Service: Ophthalmology;  Laterality: Right;  diabetic - insulin   CATARACT EXTRACTION W/PHACO Left 12/02/2020   Procedure: CATARACT EXTRACTION PHACO AND INTRAOCULAR LENS PLACEMENT (IOC)  LEFT DIABETIC 4.85 00:39.9;  Surgeon: Galen Manila, MD;  Location: [facility];  Service: Ophthalmology;  Laterality: Left;  Diabetic - insulin   COLONOSCOPY  2024   TOTAL KNEE ARTHROPLASTY Right 11/14/2023   Procedure: RIGHT TOTAL KNEE ARTHROPLASTY;  Surgeon: Tarry Kos, MD;  Location: [facility];  Service: Orthopedics;  Laterality: Right;   Social History   Occupational History   Not on file  Tobacco Use   Smoking status: Never   Smokeless tobacco: Never  Vaping Use   Vaping status: Never Used  Substance and Sexual Activity   Alcohol use: No   Drug use: Never   Sexual activity: Not Currently

## 2024-01-03 ENCOUNTER — Encounter: Payer: Self-pay | Admitting: Physical Therapy

## 2024-01-03 ENCOUNTER — Ambulatory Visit: Admitting: Physical Therapy

## 2024-01-03 DIAGNOSIS — M25661 Stiffness of right knee, not elsewhere classified: Secondary | ICD-10-CM

## 2024-01-03 DIAGNOSIS — R262 Difficulty in walking, not elsewhere classified: Secondary | ICD-10-CM

## 2024-01-03 NOTE — Therapy (Signed)
 OUTPATIENT PHYSICAL THERAPY TREATMENT   Patient Name: Lisa Fletcher MRN: [id number] DOB:15-Jan-1946, 78 y.o., female Today's Date: 01/03/2024  END OF SESSION:  PT End of Session - 01/03/24 1644     Visit Number 5    Number of Visits 24    Date for PT Re-Evaluation 03/08/24    Authorization Type MEDICARE PART B reporting period from 12/15/2023    Progress Note Due on Visit 10    PT Start Time 1605    PT Stop Time 1700    PT Time Calculation (min) 55 min    Activity Tolerance Patient tolerated treatment well;Patient limited by pain    Behavior During Therapy WFL for tasks assessed/performed                 Past Medical History:  Diagnosis Date   Arthritis    Diabetes mellitus, type 2 (HCC)    Hyperlipidemia    Hypertension    Motion sickness    car(back seat) and circular motion   Wears dentures    partial upper   Past Surgical History:  Procedure Laterality Date   ABDOMINAL HYSTERECTOMY     1970s   CARPAL TUNNEL RELEASE Left 04/20/2017   Procedure: CARPAL TUNNEL RELEASE ENDOSCOPIC;  Surgeon: Christena Flake, MD;  Location: [facility];  Service: Orthopedics;  Laterality: Left;  Diabetic - insulin   CARPAL TUNNEL RELEASE Right 06/08/2017   Procedure: CARPAL TUNNEL RELEASE ENDOSCOPIC;  Surgeon: Christena Flake, MD;  Location: [facility];  Service: Orthopedics;  Laterality: Right;   CATARACT EXTRACTION W/PHACO Right 07/10/2019   Procedure: CATARACT EXTRACTION PHACO AND INTRAOCULAR LENS PLACEMENT (IOC) RIGHT DIABETIC  01:04.9  15.3%  9.97;  Surgeon: Galen Manila, MD;  Location: [facility];  Service: Ophthalmology;  Laterality: Right;  diabetic - insulin   CATARACT EXTRACTION W/PHACO Left 12/02/2020   Procedure: CATARACT EXTRACTION PHACO AND INTRAOCULAR LENS PLACEMENT (IOC) LEFT DIABETIC 4.85 00:39.9;  Surgeon: Galen Manila, MD;  Location: [facility];  Service: Ophthalmology;  Laterality: Left;  Diabetic - insulin   COLONOSCOPY   2024   TOTAL KNEE ARTHROPLASTY Right 11/14/2023   Procedure: RIGHT TOTAL KNEE ARTHROPLASTY;  Surgeon: Tarry Kos, MD;  Location: [facility];  Service: Orthopedics;  Laterality: Right;   Patient Active Problem List   Diagnosis Date Noted   Status post total right knee replacement 11/14/2023   Primary osteoarthritis of left knee 07/27/2023   Primary osteoarthritis of right knee 07/27/2023   Varicose veins of bilateral lower extremities with pain 12/23/2017   Uncontrolled type 2 diabetes mellitus with hyperglycemia, with long-term current use of insulin (HCC) 06/30/2016   DM II (diabetes mellitus, type II), controlled (HCC) 03/24/2015   Essential hypertension with goal blood pressure less than 140/90 03/24/2015   Pure hypercholesterolemia 03/24/2015    PCP: Lauro Regulus, MD REFERRING PROVIDER: Jari Sportsman, PA-C  REFERRING DIAG: s/p Rt TKA  THERAPY DIAG:  Stiffness of right knee, not elsewhere classified  Difficulty in walking, not elsewhere classified  Rationale for Evaluation and Treatment: Rehabilitation ONSET DATE: 11/14/23  SUBJECTIVE:  PERTINENT HISTORY: History of pain in Rt knee, followed by orthopedics. No prior use of DME for mobility. Pt underwent TKE on 11/14/23 with Dr. Roda Shutters, Son came down from [place] to provide assistance for period. Now is getting intermittent assistance from her cousin (including driving assist), has been working with home health services for 5 weeks due to difficulty getting out of house. Pt reports getting  to 92 degrees with home health and working on independent HEP 1-2x daily. Pt uses RW for most walking needs in and out of house, but comes to evaluation with [facility] for simplicity and convenience.   Exercise history[place] [facility] doing knee exercises and something with her arms. She is planning to go back. She has a pair of 2#DB and 10#DB.   SUBJECTIVE STATEMENT: Patient arrives without [facility] and she has started driving.  She states after the scar massage her knee has not felt as tight. She saw Dr. Roda Shutters on Friday and he said she was looking good and recommended she stop using the cane and start driving. She has been doing her stair exercise every evening. She agrees to do this exercise 3 times a day going forwards. She has been thinking about going up the stairs at her house to get stronger.   PAIN:  Are you having pain? Discomfort at distal quad muscles  PRECAUTIONS: Knee WEIGHT BEARING RESTRICTIONS: Yes WBAT   PATIENT GOALS: return to ad lib walking and have full ROM in knee  NEXT MD VISIT:   OBJECTIVE:   R knee PROM after manual/ROM interventions:  Extension: -5  Flexion: 100 degrees (closed chain on stair step)  1 RM testing, calculated from <10RM: (last tested 12/28/2023);  Single leg press, seat position 1 R: 23.3# L: 30.8#                                                                                                              TREATMENT   Therapeutic exercise: therapeutic exercises that incorporate ONE parameter at one or more areas of the body to centralize symptoms, develop strength and endurance, range of motion, and flexibility required for successful completion of functional activities.  Recumbent bike, seat position 13. Back and forth in available range of motion with self-overpressure at end range to improve joint ROM.  5 second holds. 5 min total.  R knee flexion after: 95 degrees  Supine R hamstring stretch with strap 3x45 seconds Cuing to push heel towards ceiling  Standing AAROM R knee flexion on stairs with B UE support 2x20 with 5 second hold Foot on second step Pain in back of knee vs front  Standing AAROM R knee extension with L LE crossed over R LE and B UE support 1x5 with 5 second hold to learn exercise Added to HEP  Therapeutic activities: dynamic therapeutic activities incorporating MULTIPLE parameters or areas of the body designed to achieve improved functional  performance.  Leg press, seat position 1 B LE: 1x15 at 45# L:  3x20 at 17.5#  R:  3x20 at 15#   Manual therapy: to reduce pain and tissue tension, improve range of motion, neuromodulation, in order to promote improved ability to complete functional activities. HOOKLYING/SUPINE  R patellar mobilization: grade II-IV with lateral glide, caudal glide.   STM to R quad, triceps surae with and without "the stick" IASTM  R knee extension physiologic mobilizations, 2x30 seconds huge amplitude, 2x30  seconds grade III-IV. (Measurement taken following this - see above).   R knee flexion physiologic mobilizations with forearm behind knee and hand propped on left knee:  3x30 seconds  Improved quad relaxation  R knee PROM flexion with overpressure to tolerance 1x8 with hold to tolerance  Pt required multimodal cuing for proper technique and to facilitate improved neuromuscular control, strength, range of motion, and functional ability resulting in improved performance and form.    PATIENT EDUCATION:  Education details: Exercise purpose/form. Self management techniques.  Person educated: Patient Education method: Explanation, Demonstration, and Tactile cues Education comprehension: verbalized understanding, returned demonstration, and needs further education  HOME EXERCISE PROGRAM: Access Code: [email or web address] URL: [email or web address] Date: 01/03/2024 Prepared by: Norton Blizzard  Exercises - Seated Knee Flexion AAROM  - 3-5 x daily - 1 sets - 15-20 reps - 2-3 seconds hold - Standing Knee Flexion Stretch on Step  - 3-5 x daily - 1 sets - 15 reps - 1-2sec hold - Seated Quad Set  - 3-5 x daily - 1 sets - 20 reps - 5 second hold hold - Squat with Counter Support  - 1 x daily - 2 sets - 10 reps - Hip Hikes off step  - 3-5 x weekly - 2 sets - 10 reps - Forward Step Up with Counter Support  - 3-5 x weekly - 2 sets - 10 reps  HOME EXERCISE PROGRAM [[id number]] View at  [email or web address] using code [email or web address] passive knee extension -  Repeat 20 Repetitions, Hold 5 Seconds, Complete 1 Set, Perform 3 Times a Day  ASSESSMENT: CLINICAL IMPRESSION: Continued with focus on improving ROM including manual therapy, exercises, and updates in HEP. Also progressed leg press as tolerated. Patient with end range pain/discomfort but improved with pressure released. Emphasized 3x a day flexion and extension ROM exercises. Patient would benefit from continued management of limiting condition by skilled physical therapist to address remaining impairments and functional limitations to work towards stated goals and return to PLOF or maximal functional independence.    From initial PT evaluation on 12/15/2023:  Doreather Hoxworth is a 77yoF who presents for evaluation 5 weeks s/p Rt TKA. Exam revealing of impaired gait speed, need for device use for mobility, impaired joint ROM, reduced activity tolerance, difficulty walking, all within the anticipated scope of typical post surgical timeline. Pt is ahead of anticipated prognosis regarding AMB tolerance, however has not yet tried to ween from RW use. Transfers remain more limited than expected, and ROM is essentially typical for this stage if not just ever so slightly less than anticipated. Pt is incredibly insightful and motivated and is excited to begin the next phase of her rehab. Patient will benefit from skilled physical therapy intervention to reduce deficits and impairments identified in evaluation, in order to reduce pain, improve quality of life, and maximize activity tolerance for ADL, IADL, and leisure/fitness. Physical therapy will help pt achieve long and short term goals of care.    OBJECTIVE IMPAIRMENTS: Abnormal gait, decreased activity tolerance, decreased balance, decreased knowledge of condition, decreased knowledge of use of DME, decreased mobility, difficulty walking, decreased ROM, decreased strength, increased edema,  increased muscle spasms, impaired flexibility, improper body mechanics, and postural dysfunction.   ACTIVITY LIMITATIONS: carrying, lifting, bending, sitting, standing, squatting, stairs, transfers, bathing, and locomotion level  PARTICIPATION LIMITATIONS: meal prep, cleaning, laundry, driving, community activity, and yard work  PERSONAL FACTORS: Age, Education, Fitness, Past/current experiences, and Time since onset of injury/illness/exacerbation are also affecting patient's functional outcome.   REHAB  POTENTIAL: Excellent  CLINICAL DECISION MAKING: Stable/uncomplicated  EVALUATION COMPLEXITY: Moderate   GOALS: Goals reviewed with patient? No  SHORT TERM GOALS: Target date: 01/15/24 LEFS score >75 Baseline: 71 on 12/15/23 Goal status: In progress  2.  Rt knee ROM <11->105 degrees flexion.  Baseline: 16-88 degrees  Goal status: In progress  3.  FTSTS hands free from <19" inches in <16sec.  Baseline: 12/15/23: 19.26sec from 21"  Goal status: In progress  LONG TERM GOALS: Target date: 02/14/24  FTSTS <12sec from 17"chair height hands free Baseline:  Goal status: In progress  2.  >500 meters without antalgic gait, LRAD, no increase in pain >2 on NPRS Baseline:  Goal status: In progress  3.  Consistent and confident performance of advanced home HEP for DC.  Baseline:  Goal status: In progress  4.  SLS balance > 15sec bilat  Baseline: none  Goal status: In progress  5.  TUG<12sec Baseline: none Goal status: In progress  6.  Rt knee ROM <10 degrees extension and >122 degrees flexion.  Baseline:  Goal status: In progress   PLAN:  PT FREQUENCY: 1-2x/week  PT DURATION: 12 weeks  PLANNED INTERVENTIONS: 97110-Therapeutic exercises, 97530- Therapeutic activity, 97112- Neuromuscular re-education, [phone]- Self Care, 60454- Manual therapy, [phone]- Gait training, [phone]- Electrical stimulation (unattended), [phone]- Electrical stimulation (manual), Patient/Family  education, Balance training, Stair training, Taping, Dry Needling, Joint mobilization, Joint manipulation, Wheelchair mobility training, Cryotherapy, and Moist heat  PLAN FOR NEXT SESSION: Continue with moderate ROM interventions, low to moderate knee loading interventions, gait training; initiate proprioceptive motor control training as indicated.   Luretha Murphy. Ilsa Iha, PT, DPT 01/03/24, 5:02 PM  [facility] [address] [place], [place] [address] P: [phone] I F: [phone]

## 2024-01-05 ENCOUNTER — Encounter: Payer: Self-pay | Admitting: Physical Therapy

## 2024-01-05 ENCOUNTER — Ambulatory Visit: Admitting: Physical Therapy

## 2024-01-05 DIAGNOSIS — M25661 Stiffness of right knee, not elsewhere classified: Secondary | ICD-10-CM | POA: Diagnosis not present

## 2024-01-05 DIAGNOSIS — R262 Difficulty in walking, not elsewhere classified: Secondary | ICD-10-CM

## 2024-01-05 NOTE — Therapy (Signed)
 OUTPATIENT PHYSICAL THERAPY TREATMENT   Patient Name: Lisa Fletcher MRN: [id number] DOB:[birth year], 78 y.o., female Today's Date: 01/05/2024  END OF SESSION:  PT End of Session - 01/05/24 2035     Visit Number 6    Number of Visits 24    Date for PT Re-Evaluation 03/08/24    Authorization Type MEDICARE PART B reporting period from 12/15/2023    Progress Note Due on Visit 10    PT Start Time 1608    PT Stop Time 1647    PT Time Calculation (min) 39 min    Activity Tolerance Patient tolerated treatment well;Patient limited by pain    Behavior During Therapy WFL for tasks assessed/performed                  Past Medical History:  Diagnosis Date   Arthritis    Diabetes mellitus, type 2 (HCC)    Hyperlipidemia    Hypertension    Motion sickness    car(back seat) and circular motion   Wears dentures    partial upper   Past Surgical History:  Procedure Laterality Date   ABDOMINAL HYSTERECTOMY     1970s   CARPAL TUNNEL RELEASE Left 04/20/2017   Procedure: CARPAL TUNNEL RELEASE ENDOSCOPIC;  Surgeon: Christena Flake, MD;  Location: [facility];  Service: Orthopedics;  Laterality: Left;  Diabetic - insulin   CARPAL TUNNEL RELEASE Right 06/08/2017   Procedure: CARPAL TUNNEL RELEASE ENDOSCOPIC;  Surgeon: Christena Flake, MD;  Location: [facility];  Service: Orthopedics;  Laterality: Right;   CATARACT EXTRACTION W/PHACO Right 07/10/2019   Procedure: CATARACT EXTRACTION PHACO AND INTRAOCULAR LENS PLACEMENT (IOC) RIGHT DIABETIC  01:04.9  15.3%  9.97;  Surgeon: Galen Manila, MD;  Location: [facility];  Service: Ophthalmology;  Laterality: Right;  diabetic - insulin   CATARACT EXTRACTION W/PHACO Left 12/02/2020   Procedure: CATARACT EXTRACTION PHACO AND INTRAOCULAR LENS PLACEMENT (IOC) LEFT DIABETIC 4.85 00:39.9;  Surgeon: Galen Manila, MD;  Location: [facility];  Service: Ophthalmology;  Laterality: Left;  Diabetic - insulin   COLONOSCOPY   2024   TOTAL KNEE ARTHROPLASTY Right 11/14/2023   Procedure: RIGHT TOTAL KNEE ARTHROPLASTY;  Surgeon: Tarry Kos, MD;  Location: [facility];  Service: Orthopedics;  Laterality: Right;   Patient Active Problem List   Diagnosis Date Noted   Status post total right knee replacement 11/14/2023   Primary osteoarthritis of left knee 07/27/2023   Primary osteoarthritis of right knee 07/27/2023   Varicose veins of bilateral lower extremities with pain 12/23/2017   Uncontrolled type 2 diabetes mellitus with hyperglycemia, with long-term current use of insulin (HCC) 06/30/2016   DM II (diabetes mellitus, type II), controlled (HCC) 03/24/2015   Essential hypertension with goal blood pressure less than 140/90 03/24/2015   Pure hypercholesterolemia 03/24/2015    PCP: Lauro Regulus, MD REFERRING PROVIDER: Jari Sportsman, PA-C  REFERRING DIAG: s/p Rt TKA  THERAPY DIAG:  Stiffness of right knee, not elsewhere classified  Difficulty in walking, not elsewhere classified  Rationale for Evaluation and Treatment: Rehabilitation ONSET DATE: 11/14/23  SUBJECTIVE:  PERTINENT HISTORY: History of pain in Rt knee, followed by orthopedics. No prior use of DME for mobility. Pt underwent TKE on 11/14/23 with Dr. Roda Shutters, Son came down from [place] to provide assistance for period. Now is getting intermittent assistance from her cousin (including driving assist), has been working with home health services for 5 weeks due to difficulty getting out of house. Pt reports  getting to 92 degrees with home health and working on independent HEP 1-2x daily. Pt uses RW for most walking needs in and out of house, but comes to evaluation with [facility] for simplicity and convenience.   Exercise history: [facility] doing knee exercises and something with her arms. She is planning to go back. She has a pair of 2#DB and 10#DB.   SUBJECTIVE STATEMENT: Patient states she felt she had a good workout last PT  session. She had soreness in her muscles after last PT session. She is doing her HEP multiple times per day, maybe 6 times per day and is pushing her ROM until it hurts.   PAIN:  Are you having pain? No   PRECAUTIONS: Knee WEIGHT BEARING RESTRICTIONS: Yes WBAT   PATIENT GOALS: return to ad lib walking and have full ROM in knee  NEXT MD VISIT:   OBJECTIVE:   R knee PROM after manual/ROM interventions:  Extension: -4  Flexion: 105 degrees (closed chain on stair step)  1 RM testing, calculated from <10RM: (last tested 12/28/2023);  Single leg press, seat position 1 R: 23.3# L: 30.8#                                                                                                              TREATMENT   Therapeutic exercise: therapeutic exercises that incorporate ONE parameter at one or more areas of the body to centralize symptoms, develop strength and endurance, range of motion, and flexibility required for successful completion of functional activities.  NuStep using bilateral upper and lower extremities. Seat/handle setting 10/11. For improved extremity mobility, muscular endurance, and activity tolerance; and to induce the analgesic effect of aerobic exercise, stimulate improved joint nutrition, and prepare body structures and systems for following interventions. x  5  minutes.  Level 4 with 100 spm or above Rates 9/10 RPE but able to hold conversation with some breathlessness.  Level 4 with 100 SPM or above too high to sustain.   Recumbent bike, seat position 13. Back and forth in available range of motion with self-overpressure at end range to improve joint ROM.  5 second holds. 5 min total.   Supine R hamstring stretch with strap 3x45 seconds Cuing to push heel towards ceiling  Standing AAROM R knee flexion on stairs with B UE support 1x20 with 5 second hold Foot on second step  Therapeutic activities: dynamic therapeutic activities incorporating MULTIPLE parameters or  areas of the body designed to achieve improved functional performance.  R step up at lowest step  1x15 Verbally added to HEP  Education on how to perform stairs with patient demonstrating how she performs them and what she has trouble with.   Manual therapy: to reduce pain and tissue tension, improve range of motion, neuromodulation, in order to promote improved ability to complete functional activities. HOOKLYING/SUPINE  R patellar mobilization: grade II-IV with lateral glide, caudal glide.   STM to R quads and hamstrings with and without "the stick" IASTM  R knee extension physiologic mobilizations, 2x30 seconds huge amplitude, 2x30 seconds grade III-IV. (Measurement taken following this - see above).   R knee flexion physiologic mobilizations with forearm behind knee and hand propped on left knee:  3x30 seconds  Improved quad relaxation  R knee PROM flexion with overpressure to tolerance 3x30 seconds  Pt required multimodal cuing for proper technique and to facilitate improved neuromuscular control, strength, range of motion, and functional ability resulting in improved performance and form.    PATIENT EDUCATION:  Education details: Exercise purpose/form. Self management techniques.  Person educated: Patient Education method: Explanation, Demonstration, and Tactile cues Education comprehension: verbalized understanding, returned demonstration, and needs further education  HOME EXERCISE PROGRAM: Access Code: [email or web address] URL: [email or web address] Date: 01/03/2024 Prepared by: Norton Blizzard  Exercises - Seated Knee Flexion AAROM  - 3-5 x daily - 1 sets - 15-20 reps - 2-3 seconds hold - Standing Knee Flexion Stretch on Step  - 3-5 x daily - 1 sets - 15 reps - 1-2sec hold - Seated Quad Set  - 3-5 x daily - 1 sets - 20 reps - 5 second hold hold - Squat with Counter Support  - 1 x daily - 2 sets - 10 reps - Hip Hikes off step  - 3-5 x weekly - 2 sets - 10 reps -  Forward Step Up with Counter Support  - 3-5 x weekly - 2 sets - 10 reps  HOME EXERCISE PROGRAM [[id number]] View at [email or web address] using code [email or web address] passive knee extension -  Repeat 20 Repetitions, Hold 5 Seconds, Complete 1 Set, Perform 3 Times a Day  ASSESSMENT: CLINICAL IMPRESSION: Continued focus on improving knee ROM. Patient gained 5 degrees flexion since last PT session but continues to be quite stiff. She appears to be doing her HEP frequently enough and she tolerates pain well in the clinic which suggests she pushes herself far enough at home (she reports moving into pain during stretches as needed). Step ups added today to HEP for R quad strengthening. Good tolerance to session. Patient would benefit from continued management of limiting condition by skilled physical therapist to address remaining impairments and functional limitations to work towards stated goals and return to PLOF or maximal functional independence.   From initial PT evaluation on 12/15/2023:  Lisa Fletcher is a 77yoF who presents for evaluation 5 weeks s/p Rt TKA. Exam revealing of impaired gait speed, need for device use for mobility, impaired joint ROM, reduced activity tolerance, difficulty walking, all within the anticipated scope of typical post surgical timeline. Pt is ahead of anticipated prognosis regarding AMB tolerance, however has not yet tried to ween from RW use. Transfers remain more limited than expected, and ROM is essentially typical for this stage if not just ever so slightly less than anticipated. Pt is incredibly insightful and motivated and is excited to begin the next phase of her rehab. Patient will benefit from skilled physical therapy intervention to reduce deficits and impairments identified in evaluation, in order to reduce pain, improve quality of life, and maximize activity tolerance for ADL, IADL, and leisure/fitness. Physical therapy will help pt achieve long and short term goals of care.     OBJECTIVE IMPAIRMENTS: Abnormal gait, decreased activity tolerance, decreased balance, decreased knowledge of condition, decreased knowledge of use of DME, decreased mobility, difficulty walking, decreased ROM, decreased strength, increased edema, increased muscle spasms, impaired flexibility, improper body mechanics, and postural dysfunction.   ACTIVITY LIMITATIONS: carrying, lifting, bending, sitting, standing, squatting, stairs, transfers, bathing, and locomotion  level  PARTICIPATION LIMITATIONS: meal prep, cleaning, laundry, driving, community activity, and yard work  PERSONAL FACTORS: Age, Education, Fitness, Past/current experiences, and Time since onset of injury/illness/exacerbation are also affecting patient's functional outcome.   REHAB POTENTIAL: Excellent  CLINICAL DECISION MAKING: Stable/uncomplicated  EVALUATION COMPLEXITY: Moderate   GOALS: Goals reviewed with patient? No  SHORT TERM GOALS: Target date: 01/15/24 LEFS score >75 Baseline: 71 on 12/15/23 Goal status: In progress  2.  Rt knee ROM <11->105 degrees flexion.  Baseline: 16-88 degrees  Goal status: In progress  3.  FTSTS hands free from <19" inches in <16sec.  Baseline: 12/15/23: 19.26sec from 21"  Goal status: In progress  LONG TERM GOALS: Target date: 02/14/24  FTSTS <12sec from 17"chair height hands free Baseline:  Goal status: In progress  2.  >500 meters without antalgic gait, LRAD, no increase in pain >2 on NPRS Baseline:  Goal status: In progress  3.  Consistent and confident performance of advanced home HEP for DC.  Baseline:  Goal status: In progress  4.  SLS balance > 15sec bilat  Baseline: none  Goal status: In progress  5.  TUG<12sec Baseline: none Goal status: In progress  6.  Rt knee ROM <10 degrees extension and >122 degrees flexion.  Baseline:  Goal status: In progress   PLAN:  PT FREQUENCY: 1-2x/week  PT DURATION: 12 weeks  PLANNED INTERVENTIONS:  97110-Therapeutic exercises, 97530- Therapeutic activity, 97112- Neuromuscular re-education, [phone]- Self Care, 13244- Manual therapy, [phone]- Gait training, [phone]- Electrical stimulation (unattended), [phone]- Electrical stimulation (manual), Patient/Family education, Balance training, Stair training, Taping, Dry Needling, Joint mobilization, Joint manipulation, Wheelchair mobility training, Cryotherapy, and Moist heat  PLAN FOR NEXT SESSION: Continue with moderate ROM interventions, low to moderate knee loading interventions, gait training; initiate proprioceptive motor control training as indicated.   Luretha Murphy. Ilsa Iha, PT, DPT 01/05/24, 8:41 PM  [facility] [address] [place], [place] [address] P: [phone] I F: [phone]

## 2024-01-10 ENCOUNTER — Encounter: Payer: Self-pay | Admitting: Physical Therapy

## 2024-01-10 ENCOUNTER — Other Ambulatory Visit: Payer: Self-pay | Admitting: Orthopedic Surgery

## 2024-01-10 ENCOUNTER — Telehealth: Payer: Self-pay | Admitting: Orthopaedic Surgery

## 2024-01-10 ENCOUNTER — Ambulatory Visit: Admitting: Physical Therapy

## 2024-01-10 DIAGNOSIS — R262 Difficulty in walking, not elsewhere classified: Secondary | ICD-10-CM

## 2024-01-10 DIAGNOSIS — M25661 Stiffness of right knee, not elsewhere classified: Secondary | ICD-10-CM | POA: Diagnosis not present

## 2024-01-10 MED ORDER — OXYCODONE-ACETAMINOPHEN 5-325 MG PO TABS: 1.0000 | ORAL_TABLET | Freq: Two times a day (BID) | ORAL | 0 refills | Status: AC | PRN

## 2024-01-10 NOTE — Telephone Encounter (Signed)
 Pt called requesting a medication refill of Oxycodone-acetaminophen. To [facility] on [address] in [place].

## 2024-01-10 NOTE — Therapy (Signed)
 OUTPATIENT PHYSICAL THERAPY TREATMENT   Patient Name: BANITA LEHN MRN: [id number] DOB:1946-07-24, 78 y.o., female Today's Date: 01/11/2024  END OF SESSION:  PT End of Session - 01/10/24 1605     Visit Number 7    Number of Visits 24    Date for PT Re-Evaluation 03/08/24    Authorization Type MEDICARE PART B reporting period from 12/15/2023    Progress Note Due on Visit 10    PT Start Time 1602    PT Stop Time 1642    PT Time Calculation (min) 40 min    Activity Tolerance Patient tolerated treatment well;Patient limited by pain    Behavior During Therapy WFL for tasks assessed/performed               Past Medical History:  Diagnosis Date   Arthritis    Diabetes mellitus, type 2 (HCC)    Hyperlipidemia    Hypertension    Motion sickness    car(back seat) and circular motion   Wears dentures    partial upper   Past Surgical History:  Procedure Laterality Date   ABDOMINAL HYSTERECTOMY     1970s   CARPAL TUNNEL RELEASE Left 04/20/2017   Procedure: CARPAL TUNNEL RELEASE ENDOSCOPIC;  Surgeon: Christena Flake, MD;  Location: [facility];  Service: Orthopedics;  Laterality: Left;  Diabetic - insulin   CARPAL TUNNEL RELEASE Right 06/08/2017   Procedure: CARPAL TUNNEL RELEASE ENDOSCOPIC;  Surgeon: Christena Flake, MD;  Location: [facility];  Service: Orthopedics;  Laterality: Right;   CATARACT EXTRACTION W/PHACO Right 07/10/2019   Procedure: CATARACT EXTRACTION PHACO AND INTRAOCULAR LENS PLACEMENT (IOC) RIGHT DIABETIC  01:04.9  15.3%  9.97;  Surgeon: Galen Manila, MD;  Location: [facility];  Service: Ophthalmology;  Laterality: Right;  diabetic - insulin   CATARACT EXTRACTION W/PHACO Left 12/02/2020   Procedure: CATARACT EXTRACTION PHACO AND INTRAOCULAR LENS PLACEMENT (IOC) LEFT DIABETIC 4.85 00:39.9;  Surgeon: Galen Manila, MD;  Location: [facility];  Service: Ophthalmology;  Laterality: Left;  Diabetic - insulin   COLONOSCOPY  2024    TOTAL KNEE ARTHROPLASTY Right 11/14/2023   Procedure: RIGHT TOTAL KNEE ARTHROPLASTY;  Surgeon: Tarry Kos, MD;  Location: [facility];  Service: Orthopedics;  Laterality: Right;   Patient Active Problem List   Diagnosis Date Noted   Status post total right knee replacement 11/14/2023   Primary osteoarthritis of left knee 07/27/2023   Primary osteoarthritis of right knee 07/27/2023   Varicose veins of bilateral lower extremities with pain 12/23/2017   Uncontrolled type 2 diabetes mellitus with hyperglycemia, with long-term current use of insulin (HCC) 06/30/2016   DM II (diabetes mellitus, type II), controlled (HCC) 03/24/2015   Essential hypertension with goal blood pressure less than 140/90 03/24/2015   Pure hypercholesterolemia 03/24/2015    PCP: Lauro Regulus, MD REFERRING PROVIDER: Jari Sportsman, PA-C  REFERRING DIAG: s/p Rt TKA  THERAPY DIAG:  Stiffness of right knee, not elsewhere classified  Difficulty in walking, not elsewhere classified  Rationale for Evaluation and Treatment: Rehabilitation ONSET DATE: 11/14/23  SUBJECTIVE:  PERTINENT HISTORY: History of pain in Rt knee, followed by orthopedics. No prior use of DME for mobility. Pt underwent TKE on 11/14/23 with Dr. Roda Shutters, Son came down from [place] to provide assistance for period. Now is getting intermittent assistance from her cousin (including driving assist), has been working with home health services for 5 weeks due to difficulty getting out of house. Pt reports getting to 92  degrees with home health and working on independent HEP 1-2x daily. Pt uses RW for most walking needs in and out of house, but comes to evaluation with [facility] for simplicity and convenience.   Exercise history: [facility] doing knee exercises and something with her arms. She is planning to go back. She has a pair of 2#DB and 10#DB.   SUBJECTIVE STATEMENT: Patient states she feels improved from last PT session. She  has been doing her HEP and the back of her knee feels better and it is easier to go up and down the stairs.   PAIN:  Are you having pain? No   PRECAUTIONS: Knee WEIGHT BEARING RESTRICTIONS: Yes WBAT   PATIENT GOALS: return to ad lib walking and have full ROM in knee  NEXT MD VISIT:   OBJECTIVE:   R knee PROM after manual/ROM interventions (last measured 01/05/24):  Extension: -4  Flexion: 105 degrees (closed chain on stair step)  1 RM testing, calculated from <10RM: (last tested 12/28/2023);  Single leg press, seat position 1 R: 23.3# L: 30.8#                                                                                                              TREATMENT   Therapeutic exercise: therapeutic exercises that incorporate ONE parameter at one or more areas of the body to centralize symptoms, develop strength and endurance, range of motion, and flexibility required for successful completion of functional activities.  NuStep using bilateral upper and lower extremities. For improved extremity mobility, muscular endurance, and activity tolerance; and to induce the analgesic effect of aerobic exercise, stimulate improved joint nutrition, and prepare body structures and systems for following interventions. Also to reinforce understanding of appropriate exercise intensity to help meet physical activity guidelines for health.  Seat/handle setting: 10/11 5  minutes Level: 3-4 Target SPM: > 100 Average SPM: 107 RPE: 9/10 able to speak in short sentences  Recumbent bike, seat position 13. Back and forth in available range of motion with self-overpressure at end range to improve joint ROM.  5 second holds. 5 min total.   Supine R hamstring stretch with strap 2x60 seconds  Long sitting R quad set 1x10 with 10 second hold (improved extension ROM)  Standing AAROM R knee flexion on stairs with B UE support 1x20 with 5 second hold Foot on second step  Manual therapy: to reduce pain and  tissue tension, improve range of motion, neuromodulation, in order to promote improved ability to complete functional activities. HOOKLYING/SUPINE  R scar mobilization  STM to R quads and hamstrings  R knee extension physiologic mobilizations, 2x30 seconds huge amplitude, 2x30 seconds grade III-IV.   R tibiofemoral AP glide with KE wedge supporting femur, 2x30 seconds, grade IV  R femorotibial AP glide with KE wedge supporting tibia, 2x30 seconds, grade IV  R knee flexion physiologic mobilizations with forearm behind knee and hand propped on left knee:  2x30 seconds  Improved quad relaxation  R knee PROM flexion  with overpressure to tolerance 2x30 seconds  Pt required multimodal cuing for proper technique and to facilitate improved neuromuscular control, strength, range of motion, and functional ability resulting in improved performance and form.    PATIENT EDUCATION:  Education details: Exercise purpose/form. Self management techniques.  Person educated: Patient Education method: Explanation, Demonstration, and Tactile cues Education comprehension: verbalized understanding, returned demonstration, and needs further education  HOME EXERCISE PROGRAM: Access Code: [email or web address] URL: [email or web address] Date: 01/03/2024 Prepared by: Alleen Isle  Exercises - Seated Knee Flexion AAROM  - 3-5 x daily - 1 sets - 15-20 reps - 2-3 seconds hold - Standing Knee Flexion Stretch on Step  - 3-5 x daily - 1 sets - 15 reps - 1-2sec hold - Seated Quad Set  - 3-5 x daily - 1 sets - 20 reps - 5 second hold hold - Squat with Counter Support  - 1 x daily - 2 sets - 10 reps - Hip Hikes off step  - 3-5 x weekly - 2 sets - 10 reps - Forward Step Up with Counter Support  - 3-5 x weekly - 2 sets - 10 reps  HOME EXERCISE PROGRAM [[id number]] View at [email or web address] using code [email or web address] passive knee extension -  Repeat 20 Repetitions, Hold 5 Seconds, Complete 1 Set, Perform 3 Times a  Day  ASSESSMENT: CLINICAL IMPRESSION: Patient continues to report good participation in HEP but continues to have difficulty with gaining knee flexion. Today's session continued to focus on interventions to improve knee ROM within patient's tolerance.she demonstrated increased knee extension per observation (not able to get one finger behind knee without pressing into soft tissue/plinth slightly) following manual and quad set. Patient would benefit from continued management of limiting condition by skilled physical therapist to address remaining impairments and functional limitations to work towards stated goals and return to PLOF or maximal functional independence.    From initial PT evaluation on 12/15/2023:  Blakleigh Straw is a 77yoF who presents for evaluation 5 weeks s/p Rt TKA. Exam revealing of impaired gait speed, need for device use for mobility, impaired joint ROM, reduced activity tolerance, difficulty walking, all within the anticipated scope of typical post surgical timeline. Pt is ahead of anticipated prognosis regarding AMB tolerance, however has not yet tried to ween from RW use. Transfers remain more limited than expected, and ROM is essentially typical for this stage if not just ever so slightly less than anticipated. Pt is incredibly insightful and motivated and is excited to begin the next phase of her rehab. Patient will benefit from skilled physical therapy intervention to reduce deficits and impairments identified in evaluation, in order to reduce pain, improve quality of life, and maximize activity tolerance for ADL, IADL, and leisure/fitness. Physical therapy will help pt achieve long and short term goals of care.    OBJECTIVE IMPAIRMENTS: Abnormal gait, decreased activity tolerance, decreased balance, decreased knowledge of condition, decreased knowledge of use of DME, decreased mobility, difficulty walking, decreased ROM, decreased strength, increased edema, increased muscle  spasms, impaired flexibility, improper body mechanics, and postural dysfunction.   ACTIVITY LIMITATIONS: carrying, lifting, bending, sitting, standing, squatting, stairs, transfers, bathing, and locomotion level  PARTICIPATION LIMITATIONS: meal prep, cleaning, laundry, driving, community activity, and yard work  PERSONAL FACTORS: Age, Education, Fitness, Past/current experiences, and Time since onset of injury/illness/exacerbation are also affecting patient's functional outcome.   REHAB POTENTIAL: Excellent  CLINICAL DECISION MAKING: Stable/uncomplicated  EVALUATION COMPLEXITY: Moderate   GOALS: Goals reviewed with patient? No  SHORT TERM GOALS: Target date:  01/15/24 LEFS score >75 Baseline: 71 on 12/15/23 Goal status: In progress  2.  Rt knee ROM <11->105 degrees flexion.  Baseline: 16-88 degrees  Goal status: In progress  3.  FTSTS hands free from <19" inches in <16sec.  Baseline: 12/15/23: 19.26sec from 21"  Goal status: In progress  LONG TERM GOALS: Target date: 02/14/24  FTSTS <12sec from 17"chair height hands free Baseline:  Goal status: In progress  2.  >500 meters without antalgic gait, LRAD, no increase in pain >2 on NPRS Baseline:  Goal status: In progress  3.  Consistent and confident performance of advanced home HEP for DC.  Baseline:  Goal status: In progress  4.  SLS balance > 15sec bilat  Baseline: none  Goal status: In progress  5.  TUG<12sec Baseline: none Goal status: In progress  6.  Rt knee ROM <10 degrees extension and >122 degrees flexion.  Baseline:  Goal status: In progress   PLAN:  PT FREQUENCY: 1-2x/week  PT DURATION: 12 weeks  PLANNED INTERVENTIONS: 97110-Therapeutic exercises, 97530- Therapeutic activity, 97112- Neuromuscular re-education, [phone]- Self Care, 46962- Manual therapy, [phone]- Gait training, [phone]- Electrical stimulation (unattended), [phone]- Electrical stimulation (manual), Patient/Family education, Balance  training, Stair training, Taping, Dry Needling, Joint mobilization, Joint manipulation, Wheelchair mobility training, Cryotherapy, and Moist heat  PLAN FOR NEXT SESSION: Continue with moderate ROM interventions, low to moderate knee loading interventions, gait training; initiate proprioceptive motor control training as indicated.   Carilyn Charles. Artemio Larry, PT, DPT 01/11/24, 8:58 AM  [facility] [address] [place], [place] [address] P: [phone] I F: [phone]

## 2024-01-11 ENCOUNTER — Encounter: Payer: Self-pay | Admitting: Physical Therapy

## 2024-01-12 ENCOUNTER — Encounter: Payer: Self-pay | Admitting: Physical Therapy

## 2024-01-12 ENCOUNTER — Ambulatory Visit: Admitting: Physical Therapy

## 2024-01-12 DIAGNOSIS — M25661 Stiffness of right knee, not elsewhere classified: Secondary | ICD-10-CM

## 2024-01-12 DIAGNOSIS — R262 Difficulty in walking, not elsewhere classified: Secondary | ICD-10-CM

## 2024-01-12 NOTE — Therapy (Signed)
 OUTPATIENT PHYSICAL THERAPY TREATMENT   Patient Name: Lisa Fletcher MRN: [id number] DOB:Oct 17, 1945, 78 y.o., female Today's Date: 01/12/2024  END OF SESSION:  PT End of Session - 01/12/24 1608     Visit Number 8    Number of Visits 24    Date for PT Re-Evaluation 03/08/24    Authorization Type MEDICARE PART B reporting period from 12/15/2023    Progress Note Due on Visit 10    PT Start Time 1605    PT Stop Time 1643    PT Time Calculation (min) 38 min    Activity Tolerance Patient tolerated treatment well;Patient limited by pain    Behavior During Therapy WFL for tasks assessed/performed                Past Medical History:  Diagnosis Date   Arthritis    Diabetes mellitus, type 2 (HCC)    Hyperlipidemia    Hypertension    Motion sickness    car(back seat) and circular motion   Wears dentures    partial upper   Past Surgical History:  Procedure Laterality Date   ABDOMINAL HYSTERECTOMY     1970s   CARPAL TUNNEL RELEASE Left 04/20/2017   Procedure: CARPAL TUNNEL RELEASE ENDOSCOPIC;  Surgeon: Christena Flake, MD;  Location: [facility];  Service: Orthopedics;  Laterality: Left;  Diabetic - insulin   CARPAL TUNNEL RELEASE Right 06/08/2017   Procedure: CARPAL TUNNEL RELEASE ENDOSCOPIC;  Surgeon: Christena Flake, MD;  Location: [facility];  Service: Orthopedics;  Laterality: Right;   CATARACT EXTRACTION W/PHACO Right 07/10/2019   Procedure: CATARACT EXTRACTION PHACO AND INTRAOCULAR LENS PLACEMENT (IOC) RIGHT DIABETIC  01:04.9  15.3%  9.97;  Surgeon: Galen Manila, MD;  [facility]: [facility];  Service: Ophthalmology;  Laterality: Right;  diabetic - insulin   CATARACT EXTRACTION W/PHACO Left 12/02/2020   Procedure: CATARACT EXTRACTION PHACO AND INTRAOCULAR LENS PLACEMENT (IOC) LEFT DIABETIC 4.85 00:39.9;  Surgeon: Galen Manila, MD;  Location: [facility];  Service: Ophthalmology;  Laterality: Left;  Diabetic - insulin   COLONOSCOPY   2024   TOTAL KNEE ARTHROPLASTY Right 11/14/2023   Procedure: RIGHT TOTAL KNEE ARTHROPLASTY;  Surgeon: Tarry Kos, MD;  Location: [facility];  Service: Orthopedics;  Laterality: Right;   Patient Active Problem List   Diagnosis Date Noted   Status post total right knee replacement 11/14/2023   Primary osteoarthritis of left knee 07/27/2023   Primary osteoarthritis of right knee 07/27/2023   Varicose veins of bilateral lower extremities with pain 12/23/2017   Uncontrolled type 2 diabetes mellitus with hyperglycemia, with long-term current use of insulin (HCC) 06/30/2016   DM II (diabetes mellitus, type II), controlled (HCC) 03/24/2015   Essential hypertension with goal blood pressure less than 140/90 03/24/2015   Pure hypercholesterolemia 03/24/2015    PCP: Lauro Regulus, MD REFERRING PROVIDER: Jari Sportsman, PA-C  REFERRING DIAG: s/p Rt TKA  THERAPY DIAG:  Stiffness of right knee, not elsewhere classified  Difficulty in walking, not elsewhere classified  Rationale for Evaluation and Treatment: Rehabilitation ONSET DATE: 11/14/23  SUBJECTIVE:  PERTINENT HISTORY: History of pain in Rt knee, followed by orthopedics. No prior use of DME for mobility. Pt underwent TKE on 11/14/23 with Dr. Roda Shutters, Son came down from [place] to provide assistance for period. Now is getting intermittent assistance from her cousin (including driving assist), has been working with home health services for 5 weeks due to difficulty getting out of house. Pt reports getting to  92 degrees with home health and working on independent HEP 1-2x daily. Pt uses RW for most walking needs in and out of house, but comes to evaluation with [facility] for simplicity and convenience.   Exercise history: [facility] doing knee exercises and something with her arms. She is planning to go back. She has a pair of 2#DB and 10#DB.   SUBJECTIVE STATEMENT: Patient states she went to the gym yesterday and over  did it on the knee extension and hamstring curl machine. She is unsure how many reps she did or how much resistance she used. She feels like she can do everything at PT today except tolerate the overpressure from the physical therapist.   PAIN:  NPRS: 7/10 when she steps on it, 0/10 at rest  PRECAUTIONS: Knee WEIGHT BEARING RESTRICTIONS: Yes WBAT   PATIENT GOALS: return to ad lib walking and have full ROM in knee  NEXT MD VISIT:   OBJECTIVE:   1 RM testing, calculated from <10RM: (last tested 12/28/2023);  Single leg press, seat position 1 R: 23.3# L: 30.8#                                                                                                              TREATMENT   Therapeutic exercise: therapeutic exercises that incorporate ONE parameter at one or more areas of the body to centralize symptoms, develop strength and endurance, range of motion, and flexibility required for successful completion of functional activities.  NuStep using bilateral upper and lower extremities. For improved extremity mobility, muscular endurance, and activity tolerance; and to induce the analgesic effect of aerobic exercise, stimulate improved joint nutrition, and prepare body structures and systems for following interventions. Also to reinforce understanding of appropriate exercise intensity to help meet physical activity guidelines for health.  Seat/handle setting: 10/11 5  minutes Level: 4 Target SPM: > 100 Average SPM: 113 RPE: 6/10 able to speak in short sentences  Standing AAROM R knee flexion on stairs with B UE support 3x20 with 5 second hold Foot on second step 107 degrees flexion at last rep  Standing AAROM knee extension with self overpressure in step standing with R knee against edge of chair and L foot in chair, pulling hips towards TM bar to cause OP at the right knee into extension.  3x20 with 5 second holds  Supine R hamstring stretch with strap 2x60 seconds  Long sitting R  quad set 1x10 with 10 second hold (improved extension ROM)   Pt required multimodal cuing for proper technique and to facilitate improved neuromuscular control, strength, range of motion, and functional ability resulting in improved performance and form.    PATIENT EDUCATION:  Education details: Exercise purpose/form. Self management techniques.  Person educated: Patient Education method: Explanation, Demonstration, and Tactile cues Education comprehension: verbalized understanding, returned demonstration, and needs further education  HOME EXERCISE PROGRAM: Access Code: [email or web address] URL: [email or web address] Date: 01/03/2024 Prepared by: Alleen Isle  Exercises - Seated Knee Flexion AAROM  - 3-5 x  daily - 1 sets - 15-20 reps - 2-3 seconds hold - Standing Knee Flexion Stretch on Step  - 3-5 x daily - 1 sets - 15 reps - 1-2sec hold - Seated Quad Set  - 3-5 x daily - 1 sets - 20 reps - 5 second hold hold - Squat with Counter Support  - 1 x daily - 2 sets - 10 reps - Hip Hikes off step  - 3-5 x weekly - 2 sets - 10 reps - Forward Step Up with Counter Support  - 3-5 x weekly - 2 sets - 10 reps  HOME EXERCISE PROGRAM [[id number]] View at [email or web address] using code [email or web address] passive knee extension -  Repeat 20 Repetitions, Hold 5 Seconds, Complete 1 Set, Perform 3 Times a Day  ASSESSMENT: CLINICAL IMPRESSION: Patient sore from too much volume of quad extensions at the gym and requesting not to undergo manual therapy for improved ROM. Session focused on exercises for improved ROM as tolerated by patient. Patient tolerated well overall. Patient would benefit from continued management of limiting condition by skilled physical therapist to address remaining impairments and functional limitations to work towards stated goals and return to PLOF or maximal functional independence.   From initial PT evaluation on 12/15/2023:  Aislin Onofre is a 77yoF who presents for evaluation 5  weeks s/p Rt TKA. Exam revealing of impaired gait speed, need for device use for mobility, impaired joint ROM, reduced activity tolerance, difficulty walking, all within the anticipated scope of typical post surgical timeline. Pt is ahead of anticipated prognosis regarding AMB tolerance, however has not yet tried to ween from RW use. Transfers remain more limited than expected, and ROM is essentially typical for this stage if not just ever so slightly less than anticipated. Pt is incredibly insightful and motivated and is excited to begin the next phase of her rehab. Patient will benefit from skilled physical therapy intervention to reduce deficits and impairments identified in evaluation, in order to reduce pain, improve quality of life, and maximize activity tolerance for ADL, IADL, and leisure/fitness. Physical therapy will help pt achieve long and short term goals of care.    OBJECTIVE IMPAIRMENTS: Abnormal gait, decreased activity tolerance, decreased balance, decreased knowledge of condition, decreased knowledge of use of DME, decreased mobility, difficulty walking, decreased ROM, decreased strength, increased edema, increased muscle spasms, impaired flexibility, improper body mechanics, and postural dysfunction.   ACTIVITY LIMITATIONS: carrying, lifting, bending, sitting, standing, squatting, stairs, transfers, bathing, and locomotion level  PARTICIPATION LIMITATIONS: meal prep, cleaning, laundry, driving, community activity, and yard work  PERSONAL FACTORS: Age, Education, Fitness, Past/current experiences, and Time since onset of injury/illness/exacerbation are also affecting patient's functional outcome.   REHAB POTENTIAL: Excellent  CLINICAL DECISION MAKING: Stable/uncomplicated  EVALUATION COMPLEXITY: Moderate   GOALS: Goals reviewed with patient? No  SHORT TERM GOALS: Target date: 01/15/24 LEFS score >75 Baseline: 71 on 12/15/23 Goal status: In progress  2.  Rt knee ROM <11->105  degrees flexion.  Baseline: 16-88 degrees  Goal status: In progress  3.  FTSTS hands free from <19" inches in <16sec.  Baseline: 12/15/23: 19.26sec from 21"  Goal status: In progress  LONG TERM GOALS: Target date: 02/14/24  FTSTS <12sec from 17"chair height hands free Baseline:  Goal status: In progress  2.  >500 meters without antalgic gait, LRAD, no increase in pain >2 on NPRS Baseline:  Goal status: In progress  3.  Consistent and confident performance of advanced home HEP for DC.  Baseline:  Goal  status: In progress  4.  SLS balance > 15sec bilat  Baseline: none  Goal status: In progress  5.  TUG<12sec Baseline: none Goal status: In progress  6.  Rt knee ROM <10 degrees extension and >122 degrees flexion.  Baseline:  Goal status: In progress   PLAN:  PT FREQUENCY: 1-2x/week  PT DURATION: 12 weeks  PLANNED INTERVENTIONS: 97110-Therapeutic exercises, 97530- Therapeutic activity, 97112- Neuromuscular re-education, [phone]- Self Care, 60454- Manual therapy, [phone]- Gait training, [phone]- Electrical stimulation (unattended), [phone]- Electrical stimulation (manual), Patient/Family education, Balance training, Stair training, Taping, Dry Needling, Joint mobilization, Joint manipulation, Wheelchair mobility training, Cryotherapy, and Moist heat  PLAN FOR NEXT SESSION: Continue with moderate ROM interventions, low to moderate knee loading interventions, gait training; initiate proprioceptive motor control training as indicated.   Carilyn Charles. Artemio Larry, PT, DPT 01/12/24, 4:45 PM  [facility] [address] [place], [place] [address] P: [phone] I F: [phone]

## 2024-01-16 ENCOUNTER — Other Ambulatory Visit (INDEPENDENT_AMBULATORY_CARE_PROVIDER_SITE_OTHER)

## 2024-01-16 ENCOUNTER — Encounter: Payer: Self-pay | Admitting: Orthopedic Surgery

## 2024-01-16 ENCOUNTER — Ambulatory Visit (INDEPENDENT_AMBULATORY_CARE_PROVIDER_SITE_OTHER): Admitting: Orthopedic Surgery

## 2024-01-16 DIAGNOSIS — M79671 Pain in right foot: Secondary | ICD-10-CM | POA: Diagnosis not present

## 2024-01-16 DIAGNOSIS — M79672 Pain in left foot: Secondary | ICD-10-CM

## 2024-01-16 DIAGNOSIS — M2021 Hallux rigidus, right foot: Secondary | ICD-10-CM

## 2024-01-16 NOTE — Progress Notes (Signed)
 Office Visit Note   Patient: Lisa Fletcher           Date of Birth: 1946-02-12           MRN: [id number] Visit Date: 01/16/2024              Requested by: Jimmy Moulding, MD [address] [facility] [place],  [place] [address] PCP: Jimmy Moulding, MD  Chief Complaint  Patient presents with   Right Foot - Pain   Left Foot - Pain      HPI: Patient is a 78 year old woman who presents with pain great toe bilaterally as well as clawing of the lesser toes and the great toe migrating under the second toe worse on the right foot than the left.  Assessment & Plan: Visit Diagnoses:  1. Bilateral foot pain   2. Hallux rigidus, right foot     Plan: Due to failure of conservative care with wider stiffer shoe wear patient states that she cannot perform activities of daily living and would like to proceed with surgical intervention.  Will plan for fusion of the right great toe MTP joint and Weil osteotomy for the second metatarsal.  Plan for outpatient surgery.  Discussed that she would be off her foot for 4 weeks.  Risk and benefits were discussed including infection neurovascular injury persistent pain need for additional surgery.  Patient states she understands wished to proceed at this time.  Follow-Up Instructions: No follow-ups on file.   Ortho Exam  Patient is alert, oriented, no adenopathy, well-dressed, normal affect, normal respiratory effort. Examination patient has a palpable pulse bilaterally.  She is status post total knee arthroplasty.  Examination of the right foot she has dorsiflexion of 30 degrees plantarflexion is 0 degrees the great toe is under the second toe with clawing of the second toe secondary to a long second metatarsal.  Examination of the left foot patient has hallux rigidus with dorsiflexion of 30 degrees plantarflexion of 0 degrees with less underwriting of the great toe under the second toe with clawing of the second toe as  well.  Imaging: XR Foot 2 Views Right Result Date: 01/16/2024 2 view radiographs of the right foot shows hallux valgus deformity with osteophytic bone spurs and collapse through the MTP joint consistent with hallux rigidus.  Patient has a long second metatarsal.  XR Foot 2 Views Left Result Date: 01/16/2024 2 view radiographs of the left foot shows osteoarthritis through the midfoot as well as joint space narrowing and osteoarthritis of the MTP joint great toe.  No images are attached to the encounter.  Labs: Lab Results  Component Value Date   HGBA1C 6.7 (H) 11/08/2023     No results found for: "ALBUMIN", "PREALBUMIN", "CBC"  No results found for: "MG" No results found for: "VD25OH"  No results found for: "PREALBUMIN"    Latest Ref Rng & Units 11/08/2023    8:58 AM  CBC EXTENDED  WBC 4.0 - 10.5 K/uL 8.6   RBC 3.87 - 5.11 MIL/uL 4.42   Hemoglobin 12.0 - 15.0 g/dL 19.1   HCT 47.8 - 29.5 % 40.3   Platelets 150 - 400 K/uL 301      There is no height or weight on file to calculate BMI.  Orders:  Orders Placed This Encounter  Procedures   XR Foot 2 Views Right   XR Foot 2 Views Left   No orders of the defined types were placed in  this encounter.    Procedures: No procedures performed  Clinical Data: No additional findings.  ROS:  All other systems negative, except as noted in the HPI. Review of Systems  Objective: Vital Signs: There were no vitals taken for this visit.  Specialty Comments:  No specialty comments available.  PMFS History: Patient Active Problem List   Diagnosis Date Noted   Status post total right knee replacement 11/14/2023   Primary osteoarthritis of left knee 07/27/2023   Primary osteoarthritis of right knee 07/27/2023   Varicose veins of bilateral lower extremities with pain 12/23/2017   Uncontrolled type 2 diabetes mellitus with hyperglycemia, with long-term current use of insulin  (HCC) 06/30/2016   DM II (diabetes mellitus, type  II), controlled (HCC) 03/24/2015   Essential hypertension with goal blood pressure less than 140/90 03/24/2015   Pure hypercholesterolemia 03/24/2015   Past Medical History:  Diagnosis Date   Arthritis    Diabetes mellitus, type 2 (HCC)    Hyperlipidemia    Hypertension    Motion sickness    car(back seat) and circular motion   Wears dentures    partial upper    Family History  Problem Relation Age of Onset   Cancer Sister    Diabetes Sister    Hypertension Sister    Breast cancer Sister    Deep vein thrombosis Brother    Diabetes Brother    Hypertension Brother     Past Surgical History:  Procedure Laterality Date   ABDOMINAL HYSTERECTOMY     1970s   CARPAL TUNNEL RELEASE Left 04/20/2017   Procedure: CARPAL TUNNEL RELEASE ENDOSCOPIC;  Surgeon: Elner Hahn, MD;  Location: [facility];  Service: Orthopedics;  Laterality: Left;  Diabetic - insulin    CARPAL TUNNEL RELEASE Right 06/08/2017   Procedure: CARPAL TUNNEL RELEASE ENDOSCOPIC;  Surgeon: Elner Hahn, MD;  Location: [facility];  Service: Orthopedics;  Laterality: Right;   CATARACT EXTRACTION W/PHACO Right 07/10/2019   Procedure: CATARACT EXTRACTION PHACO AND INTRAOCULAR LENS PLACEMENT (IOC) RIGHT DIABETIC  01:04.9  15.3%  9.97;  Surgeon: Clair Crews, MD;  Location: [facility];  Service: Ophthalmology;  Laterality: Right;  diabetic - insulin    CATARACT EXTRACTION W/PHACO Left 12/02/2020   Procedure: CATARACT EXTRACTION PHACO AND INTRAOCULAR LENS PLACEMENT (IOC) LEFT DIABETIC 4.85 00:39.9;  Surgeon: Clair Crews, MD;  Location: [facility];  Service: Ophthalmology;  Laterality: Left;  Diabetic - insulin    COLONOSCOPY  2024   TOTAL KNEE ARTHROPLASTY Right 11/14/2023   Procedure: RIGHT TOTAL KNEE ARTHROPLASTY;  Surgeon: Wes Hamman, MD;  Location: [facility];  Service: Orthopedics;  Laterality: Right;   Social History   Occupational History   Not on file  Tobacco Use   Smoking  status: Never   Smokeless tobacco: Never  Vaping Use   Vaping status: Never Used  Substance and Sexual Activity   Alcohol use: No   Drug use: Never   Sexual activity: Not Currently

## 2024-01-17 ENCOUNTER — Ambulatory Visit: Admitting: Physical Therapy

## 2024-01-17 ENCOUNTER — Encounter: Payer: Self-pay | Admitting: Physical Therapy

## 2024-01-17 DIAGNOSIS — R262 Difficulty in walking, not elsewhere classified: Secondary | ICD-10-CM

## 2024-01-17 DIAGNOSIS — M25661 Stiffness of right knee, not elsewhere classified: Secondary | ICD-10-CM | POA: Diagnosis not present

## 2024-01-17 NOTE — Therapy (Signed)
 OUTPATIENT PHYSICAL THERAPY TREATMENT   Patient Name: Lisa Fletcher MRN: [id number] DOB:[birth year], 78 y.o., female Today's Date: 01/17/2024  END OF SESSION:  PT End of Session - 01/17/24 1629     Visit Number 9    Number of Visits 24    Date for PT Re-Evaluation 03/08/24    Authorization Type MEDICARE PART B reporting period from 12/15/2023    Progress Note Due on Visit 10    PT Start Time 1602    PT Stop Time 1643    PT Time Calculation (min) 41 min    Activity Tolerance Patient tolerated treatment well;Patient limited by pain    Behavior During Therapy WFL for tasks assessed/performed                 Past Medical History:  Diagnosis Date   Arthritis    Diabetes mellitus, type 2 (HCC)    Hyperlipidemia    Hypertension    Motion sickness    car(back seat) and circular motion   Wears dentures    partial upper   Past Surgical History:  Procedure Laterality Date   ABDOMINAL HYSTERECTOMY     1970s   CARPAL TUNNEL RELEASE Left 04/20/2017   Procedure: CARPAL TUNNEL RELEASE ENDOSCOPIC;  Surgeon: Elner Hahn, MD;  Location: [facility];  Service: Orthopedics;  Laterality: Left;  Diabetic - insulin    CARPAL TUNNEL RELEASE Right 06/08/2017   Procedure: CARPAL TUNNEL RELEASE ENDOSCOPIC;  Surgeon: Elner Hahn, MD;  Location: [facility];  Service: Orthopedics;  Laterality: Right;   CATARACT EXTRACTION W/PHACO Right 07/10/2019   Procedure: CATARACT EXTRACTION PHACO AND INTRAOCULAR LENS PLACEMENT (IOC) RIGHT DIABETIC  01:04.9  15.3%  9.97;  Surgeon: Clair Crews, MD;  Location: [facility];  Service: Ophthalmology;  Laterality: Right;  diabetic - insulin    CATARACT EXTRACTION W/PHACO Left 12/02/2020   Procedure: CATARACT EXTRACTION PHACO AND INTRAOCULAR LENS PLACEMENT (IOC) LEFT DIABETIC 4.85 00:39.9;  Surgeon: Clair Crews, MD;  Location: [facility];  Service: Ophthalmology;  Laterality: Left;  Diabetic - insulin    COLONOSCOPY   2024   TOTAL KNEE ARTHROPLASTY Right 11/14/2023   Procedure: RIGHT TOTAL KNEE ARTHROPLASTY;  Surgeon: Wes Hamman, MD;  Location: [facility];  Service: Orthopedics;  Laterality: Right;   Patient Active Problem List   Diagnosis Date Noted   Status post total right knee replacement 11/14/2023   Primary osteoarthritis of left knee 07/27/2023   Primary osteoarthritis of right knee 07/27/2023   Varicose veins of bilateral lower extremities with pain 12/23/2017   Uncontrolled type 2 diabetes mellitus with hyperglycemia, with long-term current use of insulin  (HCC) 06/30/2016   DM II (diabetes mellitus, type II), controlled (HCC) 03/24/2015   Essential hypertension with goal blood pressure less than 140/90 03/24/2015   Pure hypercholesterolemia 03/24/2015    PCP: Jimmy Moulding, MD REFERRING PROVIDER: Dorthy Gavia, PA-C  REFERRING DIAG: s/p Rt TKA  THERAPY DIAG:  Stiffness of right knee, not elsewhere classified  Difficulty in walking, not elsewhere classified  Rationale for Evaluation and Treatment: Rehabilitation ONSET DATE: 11/14/23  SUBJECTIVE:  PERTINENT HISTORY: History of pain in Rt knee, followed by orthopedics. No prior use of DME for mobility. Pt underwent TKE on 11/14/23 with Dr. Christiane Cowing, Son came down from [place]  to provide assistance for period. Now is getting intermittent assistance from her cousin (including driving assist), has been working with home health services for 5 weeks due to difficulty getting out of house. Pt reports getting  to 92 degrees with home health and working on independent HEP 1-2x daily. Pt uses RW for most walking needs in and out of house, but comes to evaluation with [facility] for simplicity and convenience. She stats her R knee did not bend well before surgery. She states it bends more now than it did before surgery.   Exercise history: [facility] doing knee exercises and something with her arms. She is planning to go back. She  has a pair of 2#DB and 10#DB.   SUBJECTIVE STATEMENT: Patient states she really messed up by going to the gym and over doing it last week. She states the swelling was still coming down over the weekend. She states she has trouble sleeping because of pain over her incision. She feels dryness on the right lower leg that makes it hard to sleep. Her knee felt good after last PT session except pain from over-stressing it at the gym Wednesday. She states she feels ready for graduation from PT. She has been working on bending her knee on the stairs, sometimes over and over and over again.   PAIN:  NPRS: 4/10 when she steps on it, 0/10 at rest  PRECAUTIONS: Knee WEIGHT BEARING RESTRICTIONS: Yes WBAT   PATIENT GOALS: return to ad lib walking and have full ROM in knee  NEXT MD VISIT:   OBJECTIVE:   1 RM testing, calculated from <10RM: (last tested 12/28/2023);  Single leg press, seat position 1 R: 23.3# L: 30.8#                                                                                                              TREATMENT   Therapeutic exercise: therapeutic exercises that incorporate ONE parameter at one or more areas of the body to centralize symptoms, develop strength and endurance, range of motion, and flexibility required for successful completion of functional activities.  NuStep using bilateral upper and lower extremities. For improved extremity mobility, muscular endurance, and activity tolerance; and to induce the analgesic effect of aerobic exercise, stimulate improved joint nutrition, and prepare body structures and systems for following interventions. Also to reinforce understanding of appropriate exercise intensity to help meet physical activity guidelines for health.  Seat/handle setting: 10/11 5  minutes Level: 5 Target SPM: > 100 Average SPM: 101 RPE: 5/10   Standing AAROM R knee flexion on stairs with B UE support 2x20 with 5 second hold Foot on second step 95 degrees  flexion at last rep  Standing AAROM knee extension with self overpressure with left leg crossed over right 1x20 with 5 second holds   Therapeutic activities: dynamic therapeutic activities incorporating MULTIPLE parameters or areas of the body designed to achieve improved functional performance.  Deep squat with B UE support to end range knee flexion 3x10  Manual therapy: to reduce pain and tissue tension, improve range of motion, neuromodulation, in order to promote improved ability to complete functional activities.  HOOKLYING/SUPINE   R knee scar mobilization (very tender over lower incision)  R tibiofemoral AP glide with short half foam roll supporting femur, 2x30 seconds, grade IV   R femorotibial AP glide with short half foam roll tibia, 2x30 seconds, grade IV   R tibiofemoral AP glide in 80 degrees flexion 2x30 seconds   Pt required multimodal cuing for proper technique and to facilitate improved neuromuscular control, strength, range of motion, and functional ability resulting in improved performance and form.    PATIENT EDUCATION:  Education details: Exercise purpose/form. Self management techniques.  Person educated: Patient Education method: Explanation, Demonstration, and Tactile cues Education comprehension: verbalized understanding, returned demonstration, and needs further education  HOME EXERCISE PROGRAM: Access Code: [email or web address] URL: [email or web address] Date: 01/03/2024 Prepared by: Alleen Isle  Exercises - Seated Knee Flexion AAROM  - 3-5 x daily - 1 sets - 15-20 reps - 2-3 seconds hold - Standing Knee Flexion Stretch on Step  - 3-5 x daily - 1 sets - 15 reps - 1-2sec hold - Seated Quad Set  - 3-5 x daily - 1 sets - 20 reps - 5 second hold hold - Squat with Counter Support  - 1 x daily - 2 sets - 10 reps - Hip Hikes off step  - 3-5 x weekly - 2 sets - 10 reps - Forward Step Up with Counter Support  - 3-5 x weekly - 2 sets - 10 reps  HOME  EXERCISE PROGRAM [[id number]] View at [email or web address] using code [email or web address] passive knee extension -  Repeat 20 Repetitions, Hold 5 Seconds, Complete 1 Set, Perform 3 Times a Day  ASSESSMENT: CLINICAL IMPRESSION: Patient with continued difficulty with ROM of the right knee despite report of consistent exercises for this at home. Continued with interventions to improve knee ROM and strength. Patient reported feeling better by end of session but she had less knee flexion than at last PT session.Patient would benefit from continued management of limiting condition by skilled physical therapist to address remaining impairments and functional limitations to work towards stated goals and return to PLOF or maximal functional independence.   From initial PT evaluation on 12/15/2023:  Kamdyn Covel is a 77yoF who presents for evaluation 5 weeks s/p Rt TKA. Exam revealing of impaired gait speed, need for device use for mobility, impaired joint ROM, reduced activity tolerance, difficulty walking, all within the anticipated scope of typical post surgical timeline. Pt is ahead of anticipated prognosis regarding AMB tolerance, however has not yet tried to ween from RW use. Transfers remain more limited than expected, and ROM is essentially typical for this stage if not just ever so slightly less than anticipated. Pt is incredibly insightful and motivated and is excited to begin the next phase of her rehab. Patient will benefit from skilled physical therapy intervention to reduce deficits and impairments identified in evaluation, in order to reduce pain, improve quality of life, and maximize activity tolerance for ADL, IADL, and leisure/fitness. Physical therapy will help pt achieve long and short term goals of care.    OBJECTIVE IMPAIRMENTS: Abnormal gait, decreased activity tolerance, decreased balance, decreased knowledge of condition, decreased knowledge of use of DME, decreased mobility, difficulty walking,  decreased ROM, decreased strength, increased edema, increased muscle spasms, impaired flexibility, improper body mechanics, and postural dysfunction.   ACTIVITY LIMITATIONS: carrying, lifting, bending, sitting, standing, squatting, stairs, transfers, bathing, and locomotion level  PARTICIPATION LIMITATIONS: meal prep, cleaning, laundry, driving, community activity, and yard work  PERSONAL FACTORS: Age, Education, Fitness, Past/current experiences, and Time since onset of injury/illness/exacerbation are also affecting patient's functional outcome.  REHAB POTENTIAL: Excellent  CLINICAL DECISION MAKING: Stable/uncomplicated  EVALUATION COMPLEXITY: Moderate   GOALS: Goals reviewed with patient? No  SHORT TERM GOALS: Target date: 01/15/24 LEFS score >75 Baseline: 71 on 12/15/23 Goal status: In progress  2.  Rt knee ROM <11->105 degrees flexion.  Baseline: 16-88 degrees  Goal status: In progress  3.  FTSTS hands free from <19" inches in <16sec.  Baseline: 12/15/23: 19.26sec from 21"  Goal status: In progress  LONG TERM GOALS: Target date: 02/14/24  FTSTS <12sec from 17"chair height hands free Baseline:  Goal status: In progress  2.  >500 meters without antalgic gait, LRAD, no increase in pain >2 on NPRS Baseline:  Goal status: In progress  3.  Consistent and confident performance of advanced home HEP for DC.  Baseline:  Goal status: In progress  4.  SLS balance > 15sec bilat  Baseline: none  Goal status: In progress  5.  TUG<12sec Baseline: none Goal status: In progress  6.  Rt knee ROM <10 degrees extension and >122 degrees flexion.  Baseline:  Goal status: In progress   PLAN:  PT FREQUENCY: 1-2x/week  PT DURATION: 12 weeks  PLANNED INTERVENTIONS: 97110-Therapeutic exercises, 97530- Therapeutic activity, 97112- Neuromuscular re-education, [phone]- Self Care, [id number]- Manual therapy, [phone]- Gait training, [phone]- Electrical stimulation (unattended), [phone]-  Electrical stimulation (manual), Patient/Family education, Balance training, Stair training, Taping, Dry Needling, Joint mobilization, Joint manipulation, Wheelchair mobility training, Cryotherapy, and Moist heat  PLAN FOR NEXT SESSION: Continue with moderate ROM interventions, low to moderate knee loading interventions, gait training; initiate proprioceptive motor control training as indicated.   Carilyn Charles. Artemio Larry, PT, DPT 01/17/24, 4:42 PM  [facility] [address] [place], [place] [address] P: [phone] I F: [phone]

## 2024-01-19 ENCOUNTER — Ambulatory Visit: Admitting: Physical Therapy

## 2024-01-19 DIAGNOSIS — M25661 Stiffness of right knee, not elsewhere classified: Secondary | ICD-10-CM

## 2024-01-19 DIAGNOSIS — R262 Difficulty in walking, not elsewhere classified: Secondary | ICD-10-CM

## 2024-01-19 NOTE — Therapy (Unsigned)
 OUTPATIENT PHYSICAL THERAPY TREATMENT Reporting period from 12/15/2023 to 01/19/2024   Patient Name: Lisa Fletcher MRN: [id number] DOB:1946/07/22, 78 y.o., female Today's Date: 01/20/2024  END OF SESSION:  PT End of Session - 01/19/24 1959     Visit Number 10    Number of Visits 24    Date for PT Re-Evaluation 03/08/24    Authorization Type MEDICARE PART B reporting period from 12/15/2023    Progress Note Due on Visit 10    PT Start Time 1650    PT Stop Time 1730    PT Time Calculation (min) 40 min    Activity Tolerance Patient tolerated treatment well    Behavior During Therapy WFL for tasks assessed/performed                  Past Medical History:  Diagnosis Date   Arthritis    Diabetes mellitus, type 2 (HCC)    Hyperlipidemia    Hypertension    Motion sickness    car(back seat) and circular motion   Wears dentures    partial upper   Past Surgical History:  Procedure Laterality Date   ABDOMINAL HYSTERECTOMY     1970s   CARPAL TUNNEL RELEASE Left 04/20/2017   Procedure: CARPAL TUNNEL RELEASE ENDOSCOPIC;  Surgeon: Elner Hahn, MD;  Location: [facility];  Service: Orthopedics;  Laterality: Left;  Diabetic - insulin    CARPAL TUNNEL RELEASE Right 06/08/2017   Procedure: CARPAL TUNNEL RELEASE ENDOSCOPIC;  Surgeon: Elner Hahn, MD;  Location: [facility];  Service: Orthopedics;  Laterality: Right;   CATARACT EXTRACTION W/PHACO Right 07/10/2019   Procedure: CATARACT EXTRACTION PHACO AND INTRAOCULAR LENS PLACEMENT (IOC) RIGHT DIABETIC  01:04.9  15.3%  9.97;  Surgeon: Clair Crews, MD;  Location: [facility];  Service: Ophthalmology;  Laterality: Right;  diabetic - insulin    CATARACT EXTRACTION W/PHACO Left 12/02/2020   Procedure: CATARACT EXTRACTION PHACO AND INTRAOCULAR LENS PLACEMENT (IOC) LEFT DIABETIC 4.85 00:39.9;  Surgeon: Clair Crews, MD;  Location: [facility];  Service: Ophthalmology;  Laterality: Left;  Diabetic -  insulin    COLONOSCOPY  2024   TOTAL KNEE ARTHROPLASTY Right 11/14/2023   Procedure: RIGHT TOTAL KNEE ARTHROPLASTY;  Surgeon: Wes Hamman, MD;  Location: [facility];  Service: Orthopedics;  Laterality: Right;   Patient Active Problem List   Diagnosis Date Noted   Status post total right knee replacement 11/14/2023   Primary osteoarthritis of left knee 07/27/2023   Primary osteoarthritis of right knee 07/27/2023   Varicose veins of bilateral lower extremities with pain 12/23/2017   Uncontrolled type 2 diabetes mellitus with hyperglycemia, with long-term current use of insulin  (HCC) 06/30/2016   DM II (diabetes mellitus, type II), controlled (HCC) 03/24/2015   Essential hypertension with goal blood pressure less than 140/90 03/24/2015   Pure hypercholesterolemia 03/24/2015    PCP: Jimmy Moulding, MD REFERRING PROVIDER: Dorthy Gavia, PA-C  REFERRING DIAG: s/p Rt TKA  THERAPY DIAG:  Stiffness of right knee, not elsewhere classified  Difficulty in walking, not elsewhere classified  Rationale for Evaluation and Treatment: Rehabilitation ONSET DATE: 11/14/23  SUBJECTIVE:  PERTINENT HISTORY: History of pain in Rt knee, followed by orthopedics. No prior use of DME for mobility. Pt underwent TKE on 11/14/23 with Dr. Christiane Cowing, Son came down from [place]  to provide assistance for period. Now is getting intermittent assistance from her cousin (including driving assist), has been working with home health services for 5 weeks due to difficulty getting out of  house. Pt reports getting to 92 degrees with home health and working on independent HEP 1-2x daily. Pt uses RW for most walking needs in and out of house, but comes to evaluation with [facility] for simplicity and convenience. She stats her R knee did not bend well before surgery. She states it bends more now than it did before surgery.   Exercise history: [facility] doing knee exercises and something with her arms. She is  planning to go back. She has a pair of 2#DB and 10#DB.   SUBJECTIVE STATEMENT: Patient states she has continued to work on her HEP throughout the day to improve her ROM. She would like to discharge from PT but wants to do what is best for her knee. She has no pain today.   PAIN:  NPRS: 0/10  PRECAUTIONS: Knee WEIGHT BEARING RESTRICTIONS: Yes WBAT   PATIENT GOALS: return to ad lib walking and have full ROM in knee  NEXT MD VISIT:   OBJECTIVE:   1 RM testing, calculated from <10RM: (last tested 12/28/2023);  Single leg press, seat position 1 R: 23.3# L: 30.8#  PATIENT SURVEYS:  LEFS: 92.5%   LOWER EXTREMITY ROM:   Active ROM Right eval Right  01/19/24  Knee flexion 16* 6   Knee extension 88* 96*    (Blank rows = not tested)   FUNCTIONAL TESTS:  6-Minute Walk Test ( ): Purpose: to assess function with community mobility Results: 417 meters with no AD  No pain Analysis: patient ambulated under her long term goal of 500 meters, but well beyond her baseline of 152 meters with Rollator walker.   5 Times Sit To Stand Test (5TSTS): Purpose: to assess LE functional strength and power Result: 13 seconds from 18.5 inch plinth with hands across chest,  mildly off balance and has difficulty with forwards shift due to lack of knee flexion. Unable to stand up from 18 inch surface without UE support.  Analysis: patient performed within 1 second of her goal time, but was unable to perform the test from a 17 inch surface arms free due to lack of R knee flexion that prevented her from shifting forwards sufficiently when hips lower. This will make it harder for her to get up from normal height seats while carrying something.   Timed Up and Go Test (TUG): Purpose: to assess LE functional strength, power, and balance Result: 8 seconds from 18 inch armed chair.  Used arms to go sit to stand.  Analysis: patient performed test 4 seconds faster than her goal time with no evidence of  imbalance. She requires her B UE to go sit to stand due to R knee flexion ROM deficit limiting her ability to shift over her feet adequately when going sit <> stand. She showed no balance deficits and her time is WNL.   Single leg balance:  R: > 30 seconds L: > 30 seconds  TREATMENT   Physical Performance Test or Measurement: a physical performance test or measurement with written report. Re-test of , 5TSTS, and TUG to assess progress (see above results with separate written report).   Therapeutic exercise: therapeutic exercises that incorporate ONE parameter at one or more areas of the body to centralize symptoms, develop strength and endurance, range of motion, and flexibility required for successful completion of functional activities.   Standing AAROM R knee flexion on stairs with B UE support 2x20 with 5 second hold Foot on second step 96 degrees flexion at last rep  Standing AAROM knee extension with self overpressure with left leg crossed over right 1x20 with 5 second holds  Supine R knee PROM extension measurement (see above)  Education about plan of care, progress, functional difficulties patient may encounter without ROM improving, manipulation under anesthesia.   Pt required multimodal cuing for proper technique and to facilitate improved neuromuscular control, strength, range of motion, and functional ability resulting in improved performance and form.    PATIENT EDUCATION:  Education details: Exercise purpose/form. Self management techniques. Education about plan of care, progress, functional difficulties patient may encounter without ROM improving, manipulation under anesthesia.  Person educated: Patient Education method: Explanation Education comprehension: verbalized understanding, returned demonstration, and needs further education  HOME EXERCISE  PROGRAM: Access Code: [email or web address] URL: [email or web address] Date: 01/03/2024 Prepared by: Alleen Isle  Exercises - Seated Knee Flexion AAROM  - 3-5 x daily - 1 sets - 15-20 reps - 2-3 seconds hold - Standing Knee Flexion Stretch on Step  - 3-5 x daily - 1 sets - 15 reps - 1-2sec hold - Seated Quad Set  - 3-5 x daily - 1 sets - 20 reps - 5 second hold hold - Squat with Counter Support  - 1 x daily - 2 sets - 10 reps - Hip Hikes off step  - 3-5 x weekly - 2 sets - 10 reps - Forward Step Up with Counter Support  - 3-5 x weekly - 2 sets - 10 reps  HOME EXERCISE PROGRAM [[id number]] View at [email or web address] using code [email or web address] passive knee extension -  Repeat 20 Repetitions, Hold 5 Seconds, Complete 1 Set, Perform 3 Times a Day  ASSESSMENT: CLINICAL IMPRESSION: Patient has attended 10 physical therapy sessions since starting current episode of care on 12/15/2023. She has done an excellent job at participating in HEP and has made good progress in pain reduction, ambulation ability, balance, and functional mobility. She is no longer using an assistive device and can balance on one foot for > 30 seconds bilaterally.  Unfortunately, she has had difficulty improving knee flexion ROM past 100 degrees. Last week she had increased swelling and pain following unsupervised workout in the gym where she did not keep track of how many reps she was doing. This has resolved now, but she was unable to tolerate as vigorous manual techniques last week as a result of her gym session, which may have affected her progress in the last week. Patient is happy with her current function and recovery but will likely have ongoing difficulty with transfers (especially if she is holding something in her hands), stairs, and tripping over things without more knee flexion ROM. Plan to continue working on improving ROM in PT and beefing up strengthening HEP until she returns to her doctor in mid May, where she can  discuss options for improving flexion and get her doctor's recommendations (possibly including a manipulation under anesthesia). Patient would benefit from continued management of limiting  condition by skilled physical therapist to address remaining impairments and functional limitations to work towards stated goals and return to PLOF or maximal functional independence.    From initial PT evaluation on 12/15/2023:  Wynonna Fitzhenry is a 77yoF who presents for evaluation 5 weeks s/p Rt TKA. Exam revealing of impaired gait speed, need for device use for mobility, impaired joint ROM, reduced activity tolerance, difficulty walking, all within the anticipated scope of typical post surgical timeline. Pt is ahead of anticipated prognosis regarding AMB tolerance, however has not yet tried to ween from RW use. Transfers remain more limited than expected, and ROM is essentially typical for this stage if not just ever so slightly less than anticipated. Pt is incredibly insightful and motivated and is excited to begin the next phase of her rehab. Patient will benefit from skilled physical therapy intervention to reduce deficits and impairments identified in evaluation, in order to reduce pain, improve quality of life, and maximize activity tolerance for ADL, IADL, and leisure/fitness. Physical therapy will help pt achieve long and short term goals of care.    OBJECTIVE IMPAIRMENTS: Abnormal gait, decreased activity tolerance, decreased balance, decreased knowledge of condition, decreased knowledge of use of DME, decreased mobility, difficulty walking, decreased ROM, decreased strength, increased edema, increased muscle spasms, impaired flexibility, improper body mechanics, and postural dysfunction.   ACTIVITY LIMITATIONS: carrying, lifting, bending, sitting, standing, squatting, stairs, transfers, bathing, and locomotion level  PARTICIPATION LIMITATIONS: meal prep, cleaning, laundry, driving, community activity, and yard  work  PERSONAL FACTORS: Age, Education, Fitness, Past/current experiences, and Time since onset of injury/illness/exacerbation are also affecting patient's functional outcome.   REHAB POTENTIAL: Excellent  CLINICAL DECISION MAKING: Stable/uncomplicated  EVALUATION COMPLEXITY: Moderate   GOALS: Goals reviewed with patient? No  SHORT TERM GOALS: Target date: 01/15/24 LEFS score >75 Baseline: 71 (12/15/23); 74 (01/19/2024);  Goal status: In progress  2.  Rt knee ROM <11->105 degrees flexion.  Baseline: 16-88 degrees (12/15/2023); Ext/flex: -6/96 degrees (01/19/2024); Goal status: In progress  3.  FTSTS hands free from <19" inches in <16sec.  Baseline: 19.26sec from 21" (12/15/23);  13 seconds from 18.5 inch plinth with hands across chest, mildly off balance and has difficulty with forwards shift due to lack of knee flexion. Unable to stand up from 18 inch surface without UE support (01/19/2024);  Goal status: MET  LONG TERM GOALS: Target date: 02/14/24  FTSTS <12sec from 17"chair height hands free Baseline: 19.26sec hands free form 21 inches (unable to rise hands free from lower surface), excellent symmetry (12/15/2023); 13 seconds from 18.5 inch plinth with hands across chest, mildly off balance and has difficulty with forwards shift due to lack of knee flexion. Unable to stand up from 18 inch surface without UE support (01/19/2024);  Goal status: In progress  2.  >500 meters without antalgic gait, LRAD, no increase in pain >2 on NPRS Baseline: 542ft AMB c 4WW overground: 3 minutes 53 seconds (12/15/2023); 417 meters with no AD (01/19/2024);  Goal status: In progress  3.  Consistent and confident performance of advanced home HEP for DC.  Baseline: initial HEP provided at IE (12/15/2023); has been participating well but HEP still focused heavily on improving ROM vs increased strength (01/19/2024);  Goal status: In progress  4.  SLS balance > 15sec bilat  Baseline: none (12/15/2023); > 30  seconds bilaterally (01/19/2024);  Goal status: MET  5.  TUG<12sec Baseline: none (12/15/2023); 8 seconds (01/19/2024);  Goal status: MET  6.  Rt knee ROM <10  degrees extension and >122 degrees flexion.  Baseline: 16-88 degrees (12/15/2023); Ext/flex: -6/96 degrees (01/19/2024); Goal status: In progress   PLAN:  PT FREQUENCY: 1-2x/week  PT DURATION: 12 weeks  PLANNED INTERVENTIONS: 97110-Therapeutic exercises, 97530- Therapeutic activity, [id number]- Neuromuscular re-education, [phone]- Self Care, 60454- Manual therapy, [phone]- Gait training, [phone]- Electrical stimulation (unattended), [phone]- Electrical stimulation (manual), Patient/Family education, Balance training, Stair training, Taping, Dry Needling, Joint mobilization, Joint manipulation, Wheelchair mobility training, Cryotherapy, and Moist heat  PLAN FOR NEXT SESSION: Continue with aggressive ROM interventions, incorporate more quad and hip strengthening.   Carilyn Charles. Artemio Larry, PT, DPT 01/20/24, 11:26 AM  [facility] [address] [place], [place] [address] P: [phone] I F: [phone]

## 2024-01-20 ENCOUNTER — Encounter: Payer: Self-pay | Admitting: Physical Therapy

## 2024-01-23 ENCOUNTER — Telehealth: Payer: Self-pay

## 2024-01-23 NOTE — Telephone Encounter (Signed)
 Called patient no answer LMOM. Needs appt with Dr Christiane Cowing.

## 2024-01-23 NOTE — Telephone Encounter (Signed)
Appt made for this week

## 2024-01-23 NOTE — Telephone Encounter (Signed)
 Consuela Denier, PT; Charlsie Cool, RT; Reed Canes, RMA Thanks for they update, Abraham Hoffmann.  Typically as long as they are at or above 90 degrees of flexion at 6 weeks po, we usually don't need to manipulate.  However, since it appears she has not progressed much at all over the past few weeks, it may be something to consider.  Dr. Christiane Cowing does not like to manipulate after 10 weeks po due to increased risk of femur fracture.  I'll see if the girls can get her in to see him next week.     Lauren/Liz, can you guys have her come in to see Christiane Cowing next week?

## 2024-01-24 ENCOUNTER — Ambulatory Visit: Admitting: Physical Therapy

## 2024-01-26 ENCOUNTER — Ambulatory Visit (INDEPENDENT_AMBULATORY_CARE_PROVIDER_SITE_OTHER): Admitting: Orthopaedic Surgery

## 2024-01-26 ENCOUNTER — Telehealth: Payer: Self-pay | Admitting: Orthopedic Surgery

## 2024-01-26 ENCOUNTER — Ambulatory Visit: Admitting: Physical Therapy

## 2024-01-26 DIAGNOSIS — Z96651 Presence of right artificial knee joint: Secondary | ICD-10-CM

## 2024-01-26 NOTE — Telephone Encounter (Signed)
 Disregard

## 2024-01-26 NOTE — Progress Notes (Signed)
 Post-Op Visit Note   Patient: Lisa Fletcher           Date of Birth: 09-22-1946           MRN: [id number] Visit Date: 01/26/2024 PCP: Jimmy Moulding, MD   Assessment & Plan:  Chief Complaint:  Chief Complaint  Patient presents with   Right Knee - Routine Post Op, Follow-up   Visit Diagnoses:  1. Status post total right knee replacement     Plan: History of Present Illness Lisa Fletcher is a 78 year old female who presents for a three-month follow-up after knee replacement surgery.  She experiences tightness in her knee without pain. Flexibility is improving with walking and exercise, although stiffness occurs when she is less active. She achieves knee flexion of about ninety degrees, reaching ninety-five degrees with warmups. Swelling in the knee has decreased, correlating with improved range of motion. Her leg is functional, allowing her to perform desired activities, albeit not suddenly. She has completed her physical therapy sessions and feels confident in continuing exercises independently to maintain her range of motion.  Physical Exam MUSCULOSKELETAL: Knee extension nearly full. Knee scar well-healed. Knee ligaments stable. Knee flexion to 90 degrees. Knee swelling reduced.  Assessment and Plan Knee replacement follow-up Three months post-surgery. Reports tightness, no pain. Flexibility improving. Knee extension nearly complete. Scar well-healed. Ligaments stable. Current ROM 90 degrees, potential 95 degrees. Swelling decreasing. Leg functional for daily activities. Confident in independent exercises. - Continue home exercises, can discontinue outpatient PT - Take antibiotics before dental procedures for two years post-surgery. - Schedule follow-up in three months with knee x-rays.  Follow-Up Instructions: Return in about 3 months (around 04/27/2024).   Orders:  No orders of the defined types were placed in this encounter.  No orders of the defined types were  placed in this encounter.   Imaging: No results found.  PMFS History: Patient Active Problem List   Diagnosis Date Noted   Status post total right knee replacement 11/14/2023   Primary osteoarthritis of left knee 07/27/2023   Primary osteoarthritis of right knee 07/27/2023   Varicose veins of bilateral lower extremities with pain 12/23/2017   Uncontrolled type 2 diabetes mellitus with hyperglycemia, with long-term current use of insulin  (HCC) 06/30/2016   DM II (diabetes mellitus, type II), controlled (HCC) 03/24/2015   Essential hypertension with goal blood pressure less than 140/90 03/24/2015   Pure hypercholesterolemia 03/24/2015   Past Medical History:  Diagnosis Date   Arthritis    Diabetes mellitus, type 2 (HCC)    Hyperlipidemia    Hypertension    Motion sickness    car(back seat) and circular motion   Wears dentures    partial upper    Family History  Problem Relation Age of Onset   Cancer Sister    Diabetes Sister    Hypertension Sister    Breast cancer Sister    Deep vein thrombosis Brother    Diabetes Brother    Hypertension Brother     Past Surgical History:  Procedure Laterality Date   ABDOMINAL HYSTERECTOMY     1970s   CARPAL TUNNEL RELEASE Left 04/20/2017   Procedure: CARPAL TUNNEL RELEASE ENDOSCOPIC;  Surgeon: Elner Hahn, MD;  Location: [facility];  Service: Orthopedics;  Laterality: Left;  Diabetic - insulin    CARPAL TUNNEL RELEASE Right 06/08/2017   Procedure: CARPAL TUNNEL RELEASE ENDOSCOPIC;  Surgeon: Elner Hahn, MD;  Location: [facility];  Service: Orthopedics;  Laterality:  Right;   CATARACT EXTRACTION W/PHACO Right 07/10/2019   Procedure: CATARACT EXTRACTION PHACO AND INTRAOCULAR LENS PLACEMENT (IOC) RIGHT DIABETIC  01:04.9  15.3%  9.97;  Surgeon: Clair Crews, MD;  Location: [facility];  Service: Ophthalmology;  Laterality: Right;  diabetic - insulin    CATARACT EXTRACTION W/PHACO Left 12/02/2020    Procedure: CATARACT EXTRACTION PHACO AND INTRAOCULAR LENS PLACEMENT (IOC) LEFT DIABETIC 4.85 00:39.9;  Surgeon: Clair Crews, MD;  Location: [facility];  Service: Ophthalmology;  Laterality: Left;  Diabetic - insulin    COLONOSCOPY  2024   TOTAL KNEE ARTHROPLASTY Right 11/14/2023   Procedure: RIGHT TOTAL KNEE ARTHROPLASTY;  Surgeon: Wes Hamman, MD;  Location: [facility];  Service: Orthopedics;  Laterality: Right;   Social History   Occupational History   Not on file  Tobacco Use   Smoking status: Never   Smokeless tobacco: Never  Vaping Use   Vaping status: Never Used  Substance and Sexual Activity   Alcohol use: No   Drug use: Never   Sexual activity: Not Currently

## 2024-01-27 ENCOUNTER — Telehealth: Payer: Self-pay | Admitting: Orthopedic Surgery

## 2024-01-27 NOTE — Telephone Encounter (Signed)
 I have a surgery sheet to schedule Lisa Fletcher for Right Great toe MTP fusion with osteotomy.  I called and left a message on her voicemail for her to please return my call so that we could discuss scheduling.  Looks like she may still be recovering from TKA with Dr. Christiane Cowing.

## 2024-01-31 ENCOUNTER — Ambulatory Visit

## 2024-02-01 ENCOUNTER — Ambulatory Visit (INDEPENDENT_AMBULATORY_CARE_PROVIDER_SITE_OTHER): Payer: Self-pay | Admitting: Nurse Practitioner

## 2024-02-01 ENCOUNTER — Encounter: Payer: Self-pay | Admitting: Nurse Practitioner

## 2024-02-01 VITALS — BP 120/68 | HR 84 | Temp 97.5°F | Resp 16 | Ht 63.0 in | Wt 141.6 lb

## 2024-02-01 DIAGNOSIS — M1712 Unilateral primary osteoarthritis, left knee: Secondary | ICD-10-CM

## 2024-02-01 DIAGNOSIS — E785 Hyperlipidemia, unspecified: Secondary | ICD-10-CM

## 2024-02-01 DIAGNOSIS — I83813 Varicose veins of bilateral lower extremities with pain: Secondary | ICD-10-CM

## 2024-02-01 DIAGNOSIS — E1169 Type 2 diabetes mellitus with other specified complication: Secondary | ICD-10-CM | POA: Diagnosis not present

## 2024-02-01 DIAGNOSIS — I152 Hypertension secondary to endocrine disorders: Secondary | ICD-10-CM

## 2024-02-01 DIAGNOSIS — E1159 Type 2 diabetes mellitus with other circulatory complications: Secondary | ICD-10-CM

## 2024-02-01 DIAGNOSIS — M1711 Unilateral primary osteoarthritis, right knee: Secondary | ICD-10-CM

## 2024-02-01 DIAGNOSIS — Z794 Long term (current) use of insulin: Secondary | ICD-10-CM

## 2024-02-01 DIAGNOSIS — Z96651 Presence of right artificial knee joint: Secondary | ICD-10-CM

## 2024-02-01 MED ORDER — INSULIN ISOPHANE HUMAN 100 UNIT/ML KWIKPEN: 40.0000 [IU] | PEN_INJECTOR | Freq: Every day | SUBCUTANEOUS | Status: AC

## 2024-02-01 NOTE — Progress Notes (Signed)
 [facility] [address] [place], [place] [address]  Internal MEDICINE  Office Visit Note  Patient Name: Lisa Fletcher  [id number]  [id number]  Date of Service: 02/01/2024   Complaints/HPI Pt is here for establishment of PCP. Chief Complaint  Patient presents with   New Patient (Initial Visit)    Insulin  concerns    HPI Lisa Fletcher presents for a new patient visit to establish care.  Well-appearing 78 y.o. female with diabetes, hypertension, osteoarthritis, hyperlipidemia and prior right total knee replacement. Work: retired, Engineer, drilling at [employer]: live at home alone.  Diet: fair, not always following diabetic diet.  Exercise: goes to the [facility] as often as possible, goal is to go every day.  Tobacco use: none Alcohol use: none  Illicit drug use: none  Routine CRC screening: has colonoscopy scheduled later this year, previous was in February last year  Routine mammogram: last done in December 2024, due in December this year  DEXA scan: had one done years ago, was ok  Eye exam: goes to [facility], plans to call to schedule, it has been a couple of years.  foot exam: done at annual exam  Labs: up to date for now.  New or worsening pain: none  Had a right total knee replacement done in February.    Current Medication: Outpatient Encounter Medications as of 02/01/2024  Medication Sig   Ascorbic Acid (VITAMIN C PO) Take 1 tablet by mouth daily.   Cholecalciferol (VITAMIN D) 2000 units tablet Take 2,000 Units by mouth daily.   Continuous Glucose Sensor (FREESTYLE LIBRE 2 PLUS SENSOR) MISC SMARTSIG:1 Every 2 Weeks   docusate sodium  (COLACE) 100 MG capsule Take 1 capsule (100 mg total) by mouth daily as needed.   empagliflozin (JARDIANCE) 10 MG TABS tablet Take 1 tablet by mouth daily.   GINKGO BILOBA PO Take 1 capsule by mouth daily.   Insulin  Syringe-Needle U-100 (INSULIN  SYRINGE .5CC/31GX5/16") 31G X 5/16" 0.5 ML MISC Use as directed 5 times  daily.   Multiple Vitamin (MULTIVITAMIN WITH MINERALS) TABS tablet Take 1 tablet by mouth daily.   oxyCODONE -acetaminophen  (PERCOCET) 5-325 MG tablet Take 1-2 tablets by mouth 2 (two) times daily as needed. To be taken after surgery   rivaroxaban  (XARELTO ) 10 MG TABS tablet Take 1 tablet (10 mg total) by mouth daily. To be taken after surgery to prevent blood clots   rosuvastatin (CRESTOR) 20 MG tablet Take 20 mg by mouth daily.   Semaglutide , 2 MG/DOSE, 8 MG/3ML SOPN Inject 2 mg into the skin once a week.   traZODone  (DESYREL ) 50 MG tablet Take 50 mg by mouth at bedtime.   [DISCONTINUED] doxycycline  (VIBRAMYCIN ) 100 MG capsule Take 1 capsule (100 mg total) by mouth 2 (two) times daily. To be taken after surgery   [DISCONTINUED] insulin  NPH Human (HUMULIN  N,NOVOLIN N) 100 UNIT/ML injection Inject 30 Units into the skin 2 (two) times daily before a meal.   [DISCONTINUED] Insulin  NPH, Human,, Isophane, (HUMULIN  N) 100 UNIT/ML Kiwkpen Inject 30 Units into the skin.   [DISCONTINUED] methocarbamol  (ROBAXIN -750) 750 MG tablet Take 1 tablet (750 mg total) by mouth 3 (three) times daily as needed for muscle spasms.   [DISCONTINUED] methylPREDNISolone  (MEDROL  DOSEPAK) 4 MG TBPK tablet Take as directed   [DISCONTINUED] ondansetron  (ZOFRAN ) 4 MG tablet Take 1 tablet (4 mg total) by mouth every 8 (eight) hours as needed for nausea or vomiting.   [DISCONTINUED] OZEMPIC , 1 MG/DOSE, 4 MG/3ML SOPN Inject 1 mg  into the skin once a week.   Insulin  NPH, Human,, Isophane, (HUMULIN  N) 100 UNIT/ML Kiwkpen Inject 40 Units into the skin daily.   lisinopril  (PRINIVIL ,ZESTRIL ) 10 MG tablet Take 10 mg by mouth daily.   No facility-administered encounter medications on file as of 02/01/2024.    Surgical History: Past Surgical History:  Procedure Laterality Date   ABDOMINAL HYSTERECTOMY     1970s   CARPAL TUNNEL RELEASE Left 04/20/2017   Procedure: CARPAL TUNNEL RELEASE ENDOSCOPIC;  Surgeon: Elner Hahn, MD;  Location:  [facility];  Service: Orthopedics;  Laterality: Left;  Diabetic - insulin    CARPAL TUNNEL RELEASE Right 06/08/2017   Procedure: CARPAL TUNNEL RELEASE ENDOSCOPIC;  Surgeon: Elner Hahn, MD;  Location: [facility];  Service: Orthopedics;  Laterality: Right;   CATARACT EXTRACTION W/PHACO Right 07/10/2019   Procedure: CATARACT EXTRACTION PHACO AND INTRAOCULAR LENS PLACEMENT (IOC) RIGHT DIABETIC  01:04.9  15.3%  9.97;  Surgeon: Clair Crews, MD;  Location: [facility];  Service: Ophthalmology;  Laterality: Right;  diabetic - insulin    CATARACT EXTRACTION W/PHACO Left 12/02/2020   Procedure: CATARACT EXTRACTION PHACO AND INTRAOCULAR LENS PLACEMENT (IOC) LEFT DIABETIC 4.85 00:39.9;  Surgeon: Clair Crews, MD;  Location: [facility];  Service: Ophthalmology;  Laterality: Left;  Diabetic - insulin    COLONOSCOPY  2024   TOTAL KNEE ARTHROPLASTY Right 11/14/2023   Procedure: RIGHT TOTAL KNEE ARTHROPLASTY;  Surgeon: Wes Hamman, MD;  Location: [facility];  Service: Orthopedics;  Laterality: Right;    Medical History: Past Medical History:  Diagnosis Date   Arthritis    Diabetes mellitus, type 2 (HCC)    Hyperlipidemia    Hypertension    Motion sickness    car(back seat) and circular motion   Wears dentures    partial upper    Family History: Family History  Problem Relation Age of Onset   Cancer Sister    Diabetes Sister    Hypertension Sister    Breast cancer Sister    Deep vein thrombosis Brother    Diabetes Brother    Hypertension Brother     Social History   Socioeconomic History   Marital status: Divorced    Spouse name: Not on file   Number of children: Not on file   Years of education: Not on file   Highest education level: Not on file  Occupational History   Not on file  Tobacco Use   Smoking status: Never   Smokeless tobacco: Never  Vaping Use   Vaping status: Never Used  Substance and Sexual Activity   Alcohol use: No   Drug  use: Never   Sexual activity: Not Currently  Other Topics Concern   Not on file  Social History Narrative   Not on file   Social Drivers of Health   Financial Resource Strain: Low Risk  (07/14/2023)   Received from [facility]   Overall Financial Resource Strain (CARDIA)    Difficulty of Paying Living Expenses: Not hard at all  Food Insecurity: No Food Insecurity (07/14/2023)   Received from [facility]   Hunger Vital Sign    Worried About Running Out of Food in the Last Year: Never true    Ran Out of Food in the Last Year: Never true  Transportation Needs: No Transportation Needs (07/14/2023)   Received from [facility]   PRAPARE - Transportation    Lack of Transportation (Non-Medical): No    In  the past 12 months, has lack of transportation kept you from medical appointments or from getting medications?: No  Physical Activity: Not on file  Stress: Not on file  Social Connections: Not on file  Intimate Partner Violence: Not on file     Review of Systems  Constitutional:  Positive for appetite change, fatigue and unexpected weight change (on ozempic ). Negative for chills.  HENT:  Negative for congestion, postnasal drip, rhinorrhea, sneezing and sore throat.   Eyes:  Negative for redness.  Respiratory: Negative.  Negative for cough, chest tightness, shortness of breath and wheezing.   Cardiovascular: Negative.  Negative for chest pain and palpitations.  Gastrointestinal:  Negative for abdominal pain, constipation, diarrhea, nausea and vomiting.  Genitourinary:  Negative for dysuria and frequency.  Musculoskeletal:  Positive for arthralgias (bilateral knees) and myalgias (legs). Negative for back pain, joint swelling and neck pain.  Skin:  Negative for rash.  Neurological: Negative.  Negative for tremors and numbness.  Hematological:  Negative for adenopathy. Does not bruise/bleed easily.  Psychiatric/Behavioral:  Negative  for behavioral problems (Depression), self-injury, sleep disturbance and suicidal ideas. The patient is not nervous/anxious.     Vital Signs: BP 120/68   Pulse 84   Temp (!) 97.5 F (36.4 C)   Resp 16   Ht 5\' 3"  (1.6 m)   Wt 141 lb 9.6 oz (64.2 kg)   SpO2 96%   BMI 25.08 kg/m    Physical Exam Vitals reviewed.  Constitutional:      General: She is not in acute distress.    Appearance: Normal appearance. She is not ill-appearing or diaphoretic.  HENT:     Head: Normocephalic and atraumatic.  Eyes:     Pupils: Pupils are equal, round, and reactive to light.  Cardiovascular:     Rate and Rhythm: Normal rate and regular rhythm.  Pulmonary:     Effort: Pulmonary effort is normal. No respiratory distress.  Skin:    General: Skin is warm and dry.     Capillary Refill: Capillary refill takes less than 2 seconds.  Neurological:     Mental Status: She is alert and oriented to person, place, and time.  Psychiatric:        Mood and Affect: Mood normal.        Behavior: Behavior normal.       Assessment/Plan: 1. Type 2 diabetes mellitus with other specified complication, with long-term current use of insulin  (HCC) (Primary) Continue CGM use, jardiance and insulin  as prescribed. Last A1c was stable at 6.7 in April.  - empagliflozin (JARDIANCE) 10 MG TABS tablet; Take 1 tablet by mouth daily. - Continuous Glucose Sensor (FREESTYLE LIBRE 2 PLUS SENSOR) MISC; SMARTSIG:1 Every 2 Weeks - Insulin  NPH, Human,, Isophane, (HUMULIN  N) 100 UNIT/ML Kiwkpen; Inject 40 Units into the skin daily.  2. Hypertension associated with diabetes (HCC) Controlled with lisinopril , continue as prescribed.   3. Hyperlipidemia associated with type 2 diabetes mellitus (HCC) Continue rosuvastatin as prescribed.   4. Varicose veins of bilateral lower extremities with pain Noted, has seen cardiology but has not seen vascular surgery, this may be considered in the future.   5. Primary osteoarthritis of  left knee Chronic knee pain, takes oxycodone  as needed sometimes. Sees orthopedic specialist  6. Primary osteoarthritis of right knee Chronic knee pain, takes oxycodone  as needed sometimes. Sees orthopedic specialist.  7. Status post total right knee replacement Chronic arthritis pain even after knee replacement    General Counseling: Arionna verbalizes understanding of  the findings of todays visit and agrees with plan of treatment. I have discussed any further diagnostic evaluation that may be needed or ordered today. We also reviewed her medications today. she has been encouraged to call the office with any questions or concerns that should arise related to todays visit.    No orders of the defined types were placed in this encounter.   Meds ordered this encounter  Medications   Insulin  NPH, Human,, Isophane, (HUMULIN  N) 100 UNIT/ML Kiwkpen    Sig: Inject 40 Units into the skin daily.    Return in about 4 months (around 06/03/2024) for F/U, Recheck A1C, Elveria Lauderbaugh PCP.  Time spent:30 Minutes Time spent with patient included reviewing progress notes, labs, imaging studies, and discussing plan for follow up.   [place] Controlled Substance Database was reviewed by me for overdose risk score (ORS)   This patient was seen by Laurence Pons, FNP-C in collaboration with Dr. Verneta Gone as a part of collaborative care agreement.   Alejandrina Raimer R. Bobbi Burow, MSN, FNP-C Internal Medicine

## 2024-02-02 ENCOUNTER — Ambulatory Visit: Admitting: Physical Therapy

## 2024-02-06 ENCOUNTER — Ambulatory Visit: Admitting: Physical Therapy

## 2024-02-09 ENCOUNTER — Ambulatory Visit: Admitting: Physical Therapy

## 2024-02-10 ENCOUNTER — Encounter: Admitting: Physician Assistant

## 2024-02-13 ENCOUNTER — Ambulatory Visit: Admitting: Physical Therapy

## 2024-02-14 ENCOUNTER — Ambulatory Visit (INDEPENDENT_AMBULATORY_CARE_PROVIDER_SITE_OTHER): Admitting: Nurse Practitioner

## 2024-02-14 ENCOUNTER — Encounter: Payer: Self-pay | Admitting: Nurse Practitioner

## 2024-02-14 VITALS — BP 114/70 | HR 88 | Temp 97.2°F | Resp 16 | Ht 63.0 in | Wt 137.8 lb

## 2024-02-14 DIAGNOSIS — E1169 Type 2 diabetes mellitus with other specified complication: Secondary | ICD-10-CM | POA: Diagnosis not present

## 2024-02-14 DIAGNOSIS — Z794 Long term (current) use of insulin: Secondary | ICD-10-CM | POA: Diagnosis not present

## 2024-02-14 MED ORDER — OZEMPIC (1 MG/DOSE) 4 MG/3ML ~~LOC~~ SOPN: 1.0000 mg | PEN_INJECTOR | SUBCUTANEOUS | 3 refills | Status: DC

## 2024-02-14 NOTE — Progress Notes (Signed)
 [facility] [address] [place], [place] [address]  Internal MEDICINE  Office Visit Note  Patient Name: Lisa Fletcher  [id number]  [id number]  Date of Service: 02/14/2024  Chief Complaint  Patient presents with   Acute Visit    Unintentional weight loss     HPI Lisa Fletcher presents for a follow-up visit for weight loss  Just before leaving her previous PCP, her ozempic  dose was increased to 2 mg weekly. She has continued to lose weight and her BMI is normal. The 2 mg dose may just  be too strong for her.    Current Medication: Outpatient Encounter Medications as of 02/14/2024  Medication Sig   Ascorbic Acid (VITAMIN C PO) Take 1 tablet by mouth daily.   Cholecalciferol (VITAMIN D) 2000 units tablet Take 2,000 Units by mouth daily.   Continuous Glucose Sensor (FREESTYLE LIBRE 2 PLUS SENSOR) MISC SMARTSIG:1 Every 2 Weeks   docusate sodium  (COLACE) 100 MG capsule Take 1 capsule (100 mg total) by mouth daily as needed.   empagliflozin (JARDIANCE) 10 MG TABS tablet Take 1 tablet by mouth daily.   GINKGO BILOBA PO Take 1 capsule by mouth daily.   Insulin  NPH, Human,, Isophane, (HUMULIN  N) 100 UNIT/ML Kiwkpen Inject 40 Units into the skin daily.   Insulin  Syringe-Needle U-100 (INSULIN  SYRINGE .5CC/31GX5/16) 31G X 5/16 0.5 ML MISC Use as directed 5 times daily.   Multiple Vitamin (MULTIVITAMIN WITH MINERALS) TABS tablet Take 1 tablet by mouth daily.   oxyCODONE -acetaminophen  (PERCOCET) 5-325 MG tablet Take 1-2 tablets by mouth 2 (two) times daily as needed. To be taken after surgery   rivaroxaban  (XARELTO ) 10 MG TABS tablet Take 1 tablet (10 mg total) by mouth daily. To be taken after surgery to prevent blood clots   rosuvastatin (CRESTOR) 20 MG tablet Take 20 mg by mouth daily.   Semaglutide , 1 MG/DOSE, (OZEMPIC , 1 MG/DOSE,) 4 MG/3ML SOPN Inject 1 mg into the skin once a week.   traZODone  (DESYREL ) 50 MG tablet Take 50 mg by mouth at bedtime.   [DISCONTINUED] Semaglutide , 2  MG/DOSE, 8 MG/3ML SOPN Inject 2 mg into the skin once a week.   lisinopril  (PRINIVIL ,ZESTRIL ) 10 MG tablet Take 10 mg by mouth daily.   No facility-administered encounter medications on file as of 02/14/2024.    Surgical History: Past Surgical History:  Procedure Laterality Date   ABDOMINAL HYSTERECTOMY     1970s   CARPAL TUNNEL RELEASE Left 04/20/2017   Procedure: CARPAL TUNNEL RELEASE ENDOSCOPIC;  Surgeon: Elner Hahn, MD;  Location: [facility];  Service: Orthopedics;  Laterality: Left;  Diabetic - insulin    CARPAL TUNNEL RELEASE Right 06/08/2017   Procedure: CARPAL TUNNEL RELEASE ENDOSCOPIC;  Surgeon: Elner Hahn, MD;  Location: [facility];  Service: Orthopedics;  Laterality: Right;   CATARACT EXTRACTION W/PHACO Right 07/10/2019   Procedure: CATARACT EXTRACTION PHACO AND INTRAOCULAR LENS PLACEMENT (IOC) RIGHT DIABETIC  01:04.9  15.3%  9.97;  Surgeon: Clair Crews, MD;  Location: [facility];  Service: Ophthalmology;  Laterality: Right;  diabetic - insulin    CATARACT EXTRACTION W/PHACO Left 12/02/2020   Procedure: CATARACT EXTRACTION PHACO AND INTRAOCULAR LENS PLACEMENT (IOC) LEFT DIABETIC 4.85 00:39.9;  Surgeon: Clair Crews, MD;  Location: [facility];  Service: Ophthalmology;  Laterality: Left;  Diabetic - insulin    COLONOSCOPY  2024   TOTAL KNEE ARTHROPLASTY Right 11/14/2023   Procedure: RIGHT TOTAL KNEE ARTHROPLASTY;  Surgeon: Wes Hamman, MD;  Location: [facility] OR;  Service:  Orthopedics;  Laterality: Right;    Medical History: Past Medical History:  Diagnosis Date   Arthritis    Diabetes mellitus, type 2 (HCC)    Hyperlipidemia    Hypertension    Motion sickness    car(back seat) and circular motion   Wears dentures    partial upper    Family History: Family History  Problem Relation Age of Onset   Cancer Sister    Diabetes Sister    Hypertension Sister    Breast cancer Sister    Deep vein thrombosis Brother    Diabetes  Brother    Hypertension Brother     Social History   Socioeconomic History   Marital status: Divorced    Spouse name: Not on file   Number of children: Not on file   Years of education: Not on file   Highest education level: Not on file  Occupational History   Not on file  Tobacco Use   Smoking status: Never   Smokeless tobacco: Never  Vaping Use   Vaping status: Never Used  Substance and Sexual Activity   Alcohol use: No   Drug use: Never   Sexual activity: Not Currently  Other Topics Concern   Not on file  Social History Narrative   Not on file   Social Drivers of Health   Financial Resource Strain: Low Risk  (07/14/2023)   Received from [facility]   Overall Financial Resource Strain (CARDIA)    Difficulty of Paying Living Expenses: Not hard at all  Food Insecurity: No Food Insecurity (07/14/2023)   Received from [facility]   Hunger Vital Sign    Within the past 12 months, you worried that your food would run out before you got the money to buy more.: Never true    Within the past 12 months, the food you bought just didn't last and you didn't have money to get more.: Never true  Transportation Needs: No Transportation Needs (07/14/2023)   Received from [facility]   PRAPARE - Transportation    Lack of Transportation (Non-Medical): No    In the past 12 months, has lack of transportation kept you from medical appointments or from getting medications?: No  Physical Activity: Not on file  Stress: Not on file  Social Connections: Not on file  Intimate Partner Violence: Not on file      Review of Systems  Constitutional:  Positive for appetite change, fatigue and unexpected weight change. Negative for chills.  HENT:  Negative for congestion, postnasal drip, rhinorrhea, sneezing and sore throat.   Eyes:  Negative for redness.  Respiratory: Negative.  Negative for cough, chest tightness, shortness of breath and  wheezing.   Cardiovascular: Negative.  Negative for chest pain and palpitations.  Gastrointestinal:  Negative for abdominal pain, constipation, diarrhea, nausea and vomiting.  Genitourinary:  Negative for dysuria and frequency.  Musculoskeletal:  Positive for arthralgias (bilateral knees) and myalgias (legs). Negative for back pain, joint swelling and neck pain.  Skin:  Negative for rash.  Neurological: Negative.  Negative for tremors and numbness.  Hematological:  Negative for adenopathy. Does not bruise/bleed easily.  Psychiatric/Behavioral:  Negative for behavioral problems (Depression), self-injury, sleep disturbance and suicidal ideas. The patient is not nervous/anxious.     Vital Signs: BP 114/70   Pulse 88   Temp (!) 97.2 F (36.2 C)   Resp 16   Ht 5' 3 (1.6 m)   Wt 137 lb 12.8  oz (62.5 kg)   SpO2 98%   BMI 24.41 kg/m    Physical Exam Vitals reviewed.  Constitutional:      General: She is not in acute distress.    Appearance: Normal appearance. She is not ill-appearing or diaphoretic.  HENT:     Head: Normocephalic and atraumatic.   Eyes:     Pupils: Pupils are equal, round, and reactive to light.    Cardiovascular:     Rate and Rhythm: Normal rate and regular rhythm.  Pulmonary:     Effort: Pulmonary effort is normal. No respiratory distress.   Skin:    General: Skin is warm and dry.     Capillary Refill: Capillary refill takes less than 2 seconds.   Neurological:     Mental Status: She is alert and oriented to person, place, and time.   Psychiatric:        Mood and Affect: Mood normal.        Behavior: Behavior normal.        Assessment/Plan: 1. Type 2 diabetes mellitus with other specified complication, with long-term current use of insulin  (HCC) (Primary) Ozempic  dose decreased to 1 mg weekly.  - Semaglutide , 1 MG/DOSE, (OZEMPIC , 1 MG/DOSE,) 4 MG/3ML SOPN; Inject 1 mg into the skin once a week.  Dispense: 9 mL; Refill: 3   General Counseling:  Tearia verbalizes understanding of the findings of todays visit and agrees with plan of treatment. I have discussed any further diagnostic evaluation that may be needed or ordered today. We also reviewed her medications today. she has been encouraged to call the office with any questions or concerns that should arise related to todays visit.    No orders of the defined types were placed in this encounter.   Meds ordered this encounter  Medications   Semaglutide , 1 MG/DOSE, (OZEMPIC , 1 MG/DOSE,) 4 MG/3ML SOPN    Sig: Inject 1 mg into the skin once a week.    Dispense:  9 mL    Refill:  3    Discontinue 2 mg dose now, fill new script today. Dose decreased due to adverse effects of higher dose.    Return for keep upcoming follow up visit. .   Total time spent:20 Minutes Time spent includes review of chart, medications, test results, and follow up plan with the patient.   [place] Controlled Substance Database was reviewed by me.  This patient was seen by Laurence Pons, FNP-C in collaboration with Dr. Verneta Gone as a part of collaborative care agreement.   Maximilliano Kersh R. Bobbi Burow, MSN, FNP-C Internal medicine

## 2024-02-15 ENCOUNTER — Encounter: Admitting: Physical Therapy

## 2024-02-21 ENCOUNTER — Encounter: Admitting: Physical Therapy

## 2024-02-23 ENCOUNTER — Encounter: Admitting: Physical Therapy

## 2024-03-03 ENCOUNTER — Encounter: Payer: Self-pay | Admitting: Nurse Practitioner

## 2024-03-11 ENCOUNTER — Encounter: Payer: Self-pay | Admitting: Nurse Practitioner

## 2024-03-16 ENCOUNTER — Other Ambulatory Visit: Payer: Self-pay | Admitting: Physician Assistant

## 2024-03-16 ENCOUNTER — Telehealth: Payer: Self-pay | Admitting: Orthopaedic Surgery

## 2024-03-16 MED ORDER — TRAMADOL HCL 50 MG PO TABS: 50.0000 mg | ORAL_TABLET | Freq: Two times a day (BID) | ORAL | 0 refills | Status: AC | PRN

## 2024-03-16 NOTE — Telephone Encounter (Signed)
 Patient called and needs a refill on Oxycodone . CB#[phone]

## 2024-03-16 NOTE — Telephone Encounter (Signed)
 Cannot refill any narcotics at this point. Sent in tramadol

## 2024-03-16 NOTE — Telephone Encounter (Signed)
 Called and notified patient.

## 2024-04-16 ENCOUNTER — Telehealth: Payer: Self-pay | Admitting: Orthopaedic Surgery

## 2024-04-16 NOTE — Telephone Encounter (Signed)
 Had right TKA in Feb. And was given a 6mon placard then.  Please advise about permanent placard.

## 2024-04-16 NOTE — Telephone Encounter (Signed)
 sure

## 2024-04-16 NOTE — Telephone Encounter (Signed)
 Patient called would like a permanent handicap placard. Would like to know if Dr. Jerri will fill out the form?

## 2024-04-17 NOTE — Telephone Encounter (Signed)
 Called and left voicemail that handicap placard application is up front for pick up.

## 2024-04-19 ENCOUNTER — Telehealth: Payer: Self-pay | Admitting: Radiology

## 2024-04-19 NOTE — Telephone Encounter (Signed)
 Notified patient that this was placed up front on the 21st.

## 2024-04-19 NOTE — Telephone Encounter (Signed)
 Patient is asking for a permanent handicap placard application, please call her and advise if ok and if you can get it ready to pickup for her, thanks.

## 2024-04-27 ENCOUNTER — Ambulatory Visit: Admitting: Orthopaedic Surgery

## 2024-05-07 ENCOUNTER — Ambulatory Visit (INDEPENDENT_AMBULATORY_CARE_PROVIDER_SITE_OTHER): Admitting: Nurse Practitioner

## 2024-05-07 ENCOUNTER — Encounter: Payer: Self-pay | Admitting: Nurse Practitioner

## 2024-05-07 VITALS — BP 126/74 | HR 83 | Temp 97.7°F | Resp 16 | Ht 63.0 in | Wt 133.8 lb

## 2024-05-07 DIAGNOSIS — E559 Vitamin D deficiency, unspecified: Secondary | ICD-10-CM

## 2024-05-07 DIAGNOSIS — L659 Nonscarring hair loss, unspecified: Secondary | ICD-10-CM

## 2024-05-07 DIAGNOSIS — E1159 Type 2 diabetes mellitus with other circulatory complications: Secondary | ICD-10-CM | POA: Diagnosis not present

## 2024-05-07 DIAGNOSIS — G4709 Other insomnia: Secondary | ICD-10-CM

## 2024-05-07 DIAGNOSIS — R5383 Other fatigue: Secondary | ICD-10-CM

## 2024-05-07 DIAGNOSIS — E1169 Type 2 diabetes mellitus with other specified complication: Secondary | ICD-10-CM

## 2024-05-07 DIAGNOSIS — I152 Hypertension secondary to endocrine disorders: Secondary | ICD-10-CM

## 2024-05-07 DIAGNOSIS — E538 Deficiency of other specified B group vitamins: Secondary | ICD-10-CM

## 2024-05-07 DIAGNOSIS — E785 Hyperlipidemia, unspecified: Secondary | ICD-10-CM

## 2024-05-07 LAB — POCT GLYCOSYLATED HEMOGLOBIN (HGB A1C): Hemoglobin A1C: 7.5 % — AB (ref 4.0–5.6)

## 2024-05-07 MED ORDER — FREESTYLE LIBRE 2 PLUS SENSOR MISC: 3 refills | Status: DC

## 2024-05-07 MED ORDER — ROSUVASTATIN CALCIUM 20 MG PO TABS: 20.0000 mg | ORAL_TABLET | Freq: Every day | ORAL | 3 refills | Status: DC

## 2024-05-07 MED ORDER — EMPAGLIFLOZIN 25 MG PO TABS: ORAL_TABLET | ORAL | 3 refills | Status: DC

## 2024-05-07 MED ORDER — TRAZODONE HCL 150 MG PO TABS: 150.0000 mg | ORAL_TABLET | Freq: Every day | ORAL | 3 refills | Status: DC

## 2024-05-07 MED ORDER — LISINOPRIL 10 MG PO TABS: 10.0000 mg | ORAL_TABLET | Freq: Every day | ORAL | 3 refills | Status: DC

## 2024-05-07 NOTE — Progress Notes (Signed)
 [facility] [address] [place], [place] [address]  Internal MEDICINE  Office Visit Note  Patient Name: Lisa Fletcher  [id number]  [id number]  Date of Service: 05/07/2024  Chief Complaint  Patient presents with   Diabetes   Hyperlipidemia   Hypertension   Follow-up    HPI Maronda presents for a follow-up visit for diabetes, hair loss, fatigue and insomnia.  Diabetes -- A1c increased to 7.5, patient has been eating foods high in carbs lately and her ozempic  dose was decreased due to weight loss. Jardiance  dose is only 10 mg daily.  Hair loss -- increased hair loss -- no recent vitamin D  lab, no history of thyroid problems.  Fatigue -- increased fatigue which patient thought was from ozempic  but she has also not been sleeping well, need vitamin levels and thyroid checked as well.  Insomnia -- taking 2 trazodone  tablets for sleep and still having difficulty. Wants to increase the dose.     Current Medication: Outpatient Encounter Medications as of 05/07/2024  Medication Sig   empagliflozin  (JARDIANCE ) 25 MG TABS tablet Take one tab a day for diabetes   traZODone  (DESYREL ) 150 MG tablet Take 1-2 tablets (150-300 mg total) by mouth at bedtime.   Ascorbic Acid (VITAMIN C PO) Take 1 tablet by mouth daily.   Cholecalciferol (VITAMIN D ) 2000 units tablet Take 2,000 Units by mouth daily.   Continuous Glucose Sensor (FREESTYLE LIBRE 2 PLUS SENSOR) MISC Change sensor every 15 days for CGM for diabete E11.65   docusate sodium  (COLACE) 100 MG capsule Take 1 capsule (100 mg total) by mouth daily as needed.   GINKGO BILOBA PO Take 1 capsule by mouth daily.   Insulin  NPH, Human,, Isophane, (HUMULIN  N) 100 UNIT/ML Kiwkpen Inject 40 Units into the skin daily.   Insulin  Syringe-Needle U-100 (INSULIN  SYRINGE .5CC/31GX5/16) 31G X 5/16 0.5 ML MISC Use as directed 5 times daily.   lisinopril  (ZESTRIL ) 10 MG tablet Take 1 tablet (10 mg total) by mouth daily.   Multiple Vitamin  (MULTIVITAMIN WITH MINERALS) TABS tablet Take 1 tablet by mouth daily.   oxyCODONE -acetaminophen  (PERCOCET) 5-325 MG tablet Take 1-2 tablets by mouth 2 (two) times daily as needed. To be taken after surgery   rosuvastatin  (CRESTOR ) 20 MG tablet Take 1 tablet (20 mg total) by mouth daily.   Semaglutide , 1 MG/DOSE, (OZEMPIC , 1 MG/DOSE,) 4 MG/3ML SOPN Inject 1 mg into the skin once a week.   traMADol  (ULTRAM ) 50 MG tablet Take 1 tablet (50 mg total) by mouth 2 (two) times daily as needed.   [DISCONTINUED] Continuous Glucose Sensor (FREESTYLE LIBRE 2 PLUS SENSOR) MISC SMARTSIG:1 Every 2 Weeks   [DISCONTINUED] empagliflozin  (JARDIANCE ) 10 MG TABS tablet Take 1 tablet by mouth daily.   [DISCONTINUED] lisinopril  (PRINIVIL ,ZESTRIL ) 10 MG tablet Take 10 mg by mouth daily.   [DISCONTINUED] rivaroxaban  (XARELTO ) 10 MG TABS tablet Take 1 tablet (10 mg total) by mouth daily. To be taken after surgery to prevent blood clots   [DISCONTINUED] rosuvastatin  (CRESTOR ) 20 MG tablet Take 20 mg by mouth daily.   [DISCONTINUED] traZODone  (DESYREL ) 50 MG tablet Take 50 mg by mouth at bedtime.   No facility-administered encounter medications on file as of 05/07/2024.    Surgical History: Past Surgical History:  Procedure Laterality Date   ABDOMINAL HYSTERECTOMY     1970s   CARPAL TUNNEL RELEASE Left 04/20/2017   Procedure: CARPAL TUNNEL RELEASE ENDOSCOPIC;  Surgeon: Edie Norleen PARAS, MD;  Location: [facility];  Service: Orthopedics;  Laterality: Left;  Diabetic - insulin    CARPAL TUNNEL RELEASE Right 06/08/2017   Procedure: CARPAL TUNNEL RELEASE ENDOSCOPIC;  Surgeon: Edie Norleen PARAS, MD;  Location: [facility];  Service: Orthopedics;  Laterality: Right;   CATARACT EXTRACTION W/PHACO Right 07/10/2019   Procedure: CATARACT EXTRACTION PHACO AND INTRAOCULAR LENS PLACEMENT (IOC) RIGHT DIABETIC  01:04.9  15.3%  9.97;  Surgeon: Jaye Fallow, MD;  Location: [facility];  Service: Ophthalmology;   Laterality: Right;  diabetic - insulin    CATARACT EXTRACTION W/PHACO Left 12/02/2020   Procedure: CATARACT EXTRACTION PHACO AND INTRAOCULAR LENS PLACEMENT (IOC) LEFT DIABETIC 4.85 00:39.9;  Surgeon: Jaye Fallow, MD;  Location: [facility];  Service: Ophthalmology;  Laterality: Left;  Diabetic - insulin    COLONOSCOPY  2024   TOTAL KNEE ARTHROPLASTY Right 11/14/2023   Procedure: RIGHT TOTAL KNEE ARTHROPLASTY;  Surgeon: Jerri Kay HERO, MD;  Location: [facility] OR;  Service: Orthopedics;  Laterality: Right;    Medical History: Past Medical History:  Diagnosis Date   Arthritis    Diabetes mellitus, type 2 (HCC)    Hyperlipidemia    Hypertension    Motion sickness    car(back seat) and circular motion   Wears dentures    partial upper    Family History: Family History  Problem Relation Age of Onset   Cancer Sister    Diabetes Sister    Hypertension Sister    Breast cancer Sister    Deep vein thrombosis Brother    Diabetes Brother    Hypertension Brother     Social History   Socioeconomic History   Marital status: Divorced    Spouse name: Not on file   Number of children: Not on file   Years of education: Not on file   Highest education level: Not on file  Occupational History   Not on file  Tobacco Use   Smoking status: Never   Smokeless tobacco: Never  Vaping Use   Vaping status: Never Used  Substance and Sexual Activity   Alcohol use: No   Drug use: Never   Sexual activity: Not Currently  Other Topics Concern   Not on file  Social History Narrative   Not on file   Social Drivers of Health   Financial Resource Strain: Low Risk  (07/14/2023)   Received from [facility]   Overall Financial Resource Strain (CARDIA)    Difficulty of Paying Living Expenses: Not hard at all  Food Insecurity: No Food Insecurity (07/14/2023)   Received from [facility]   Hunger Vital Sign    Within the past 12 months, you worried that your  food would run out before you got the money to buy more.: Never true    Within the past 12 months, the food you bought just didn't last and you didn't have money to get more.: Never true  Transportation Needs: No Transportation Needs (07/14/2023)   Received from [facility]   PRAPARE - Transportation    Lack of Transportation (Non-Medical): No    In the past 12 months, has lack of transportation kept you from medical appointments or from getting medications?: No  Physical Activity: Not on file  Stress: Not on file  Social Connections: Not on file  Intimate Partner Violence: Not on file      Review of Systems  Constitutional:  Positive for appetite change, fatigue and unexpected weight change. Negative for chills.  HENT:  Negative for congestion, postnasal drip, rhinorrhea, sneezing  and sore throat.        Hair loss  Eyes:  Negative for redness.  Respiratory: Negative.  Negative for cough, chest tightness, shortness of breath and wheezing.   Cardiovascular: Negative.  Negative for chest pain and palpitations.  Gastrointestinal:  Negative for abdominal pain, constipation, diarrhea, nausea and vomiting.  Genitourinary:  Negative for dysuria and frequency.  Musculoskeletal:  Positive for arthralgias (bilateral knees) and myalgias (legs). Negative for back pain, joint swelling and neck pain.  Skin:  Negative for rash.  Neurological: Negative.  Negative for tremors and numbness.  Hematological:  Negative for adenopathy. Does not bruise/bleed easily.  Psychiatric/Behavioral:  Positive for sleep disturbance. Negative for behavioral problems (Depression), self-injury and suicidal ideas. The patient is not nervous/anxious.     Vital Signs: BP 126/74   Pulse 83   Temp 97.7 F (36.5 C)   Resp 16   Ht 5' 3 (1.6 m)   Wt 133 lb 12.8 oz (60.7 kg)   SpO2 97%   BMI 23.70 kg/m    Physical Exam Vitals reviewed.  Constitutional:      General: She is not in acute  distress.    Appearance: Normal appearance. She is normal weight. She is not ill-appearing.  HENT:     Head: Normocephalic and atraumatic.  Eyes:     Pupils: Pupils are equal, round, and reactive to light.  Cardiovascular:     Rate and Rhythm: Normal rate and regular rhythm.  Pulmonary:     Effort: Pulmonary effort is normal. No respiratory distress.  Neurological:     Mental Status: She is alert and oriented to person, place, and time.  Psychiatric:        Mood and Affect: Mood normal.        Behavior: Behavior normal.        Assessment/Plan: 1. Type 2 diabetes mellitus with other specified complication, with long-term current use of insulin  (HCC) (Primary) A1c is elevated at 7.5, jardiance  dose increased. - POCT glycosylated hemoglobin (Hb A1C) - empagliflozin  (JARDIANCE ) 25 MG TABS tablet; Take one tab a day for diabetes  Dispense: 90 tablet; Refill: 3 - Continuous Glucose Sensor (FREESTYLE LIBRE 2 PLUS SENSOR) MISC; Change sensor every 15 days for CGM for diabete E11.65  Dispense: 6 each; Refill: 3  2. Hypertension associated with diabetes (HCC) Stable, continue lisinopril  as prescribed.  - lisinopril  (ZESTRIL ) 10 MG tablet; Take 1 tablet (10 mg total) by mouth daily.  Dispense: 90 tablet; Refill: 3  3. Hyperlipidemia associated with type 2 diabetes mellitus (HCC) Continue rosuvastatin  as prescribed.  - rosuvastatin  (CRESTOR ) 20 MG tablet; Take 1 tablet (20 mg total) by mouth daily.  Dispense: 90 tablet; Refill: 3  4. Hair loss Routine labs ordered  - Vitamin D  (25 hydroxy) - B12 and Folate Panel - TSH + free T4 - Iron, TIBC and Ferritin Panel  5. Other fatigue Routine labs ordered  - Vitamin D  (25 hydroxy) - B12 and Folate Panel - TSH + free T4 - Iron, TIBC and Ferritin Panel  6. Other insomnia Trazodone  dose increased, take as prescribed.  - traZODone  (DESYREL ) 150 MG tablet; Take 1-2 tablets (150-300 mg total) by mouth at bedtime.  Dispense: 180 tablet;  Refill: 3  7. B12 deficiency Routine labs ordered  - Vitamin D  (25 hydroxy) - B12 and Folate Panel - TSH + free T4 - Iron, TIBC and Ferritin Panel  8. Vitamin D  deficiency Routine labs ordered  - Vitamin D  (25 hydroxy) - B12  and Folate Panel - TSH + free T4 - Iron, TIBC and Ferritin Panel   General Counseling: Elaynah verbalizes understanding of the findings of todays visit and agrees with plan of treatment. I have discussed any further diagnostic evaluation that may be needed or ordered today. We also reviewed her medications today. she has been encouraged to call the office with any questions or concerns that should arise related to todays visit.    Orders Placed This Encounter  Procedures   Vitamin D  (25 hydroxy)   B12 and Folate Panel   TSH + free T4   Iron, TIBC and Ferritin Panel   POCT glycosylated hemoglobin (Hb A1C)    Meds ordered this encounter  Medications   empagliflozin  (JARDIANCE ) 25 MG TABS tablet    Sig: Take one tab a day for diabetes    Dispense:  90 tablet    Refill:  3    Note increased dose, discontinue 10 mg dose and fill new script today.   Continuous Glucose Sensor (FREESTYLE LIBRE 2 PLUS SENSOR) MISC    Sig: Change sensor every 15 days for CGM for diabete E11.65    Dispense:  6 each    Refill:  3    E11.65   rosuvastatin  (CRESTOR ) 20 MG tablet    Sig: Take 1 tablet (20 mg total) by mouth daily.    Dispense:  90 tablet    Refill:  3   lisinopril  (ZESTRIL ) 10 MG tablet    Sig: Take 1 tablet (10 mg total) by mouth daily.    Dispense:  90 tablet    Refill:  3   traZODone  (DESYREL ) 150 MG tablet    Sig: Take 1-2 tablets (150-300 mg total) by mouth at bedtime.    Dispense:  180 tablet    Refill:  3    Note increased dose, fill new script today, discontinue 50 mg tablet.    Return for F/U to discuss lab results in 1 week. .   Total time spent:30 Minutes Time spent includes review of chart, medications, test results, and follow up plan  with the patient.   [place] Controlled Substance Database was reviewed by me.  This patient was seen by Mardy Maxin, FNP-C in collaboration with Dr. Sigrid Bathe as a part of collaborative care agreement.   Aadith Raudenbush R. Maxin, MSN, FNP-C Internal medicine

## 2024-05-08 LAB — IRON,TIBC AND FERRITIN PANEL
Ferritin: 130 ng/mL (ref 15–150)
Iron Saturation: 33 % (ref 15–55)
Iron: 99 ug/dL (ref 27–139)
Total Iron Binding Capacity: 300 ug/dL (ref 250–450)
UIBC: 201 ug/dL (ref 118–369)

## 2024-05-08 LAB — TSH+FREE T4
Free T4: 0.94 ng/dL (ref 0.82–1.77)
TSH: 0.712 u[IU]/mL (ref 0.450–4.500)

## 2024-05-08 LAB — B12 AND FOLATE PANEL
Folate: 17.2 ng/mL (ref >3.0)
Vitamin B-12: 702 pg/mL (ref 232–1245)

## 2024-05-08 LAB — VITAMIN D 25 HYDROXY (VIT D DEFICIENCY, FRACTURES): Vit D, 25-Hydroxy: 27.8 ng/mL — ABNORMAL LOW (ref 30.0–100.0)

## 2024-05-14 ENCOUNTER — Encounter: Payer: Self-pay | Admitting: Nurse Practitioner

## 2024-05-14 ENCOUNTER — Ambulatory Visit (INDEPENDENT_AMBULATORY_CARE_PROVIDER_SITE_OTHER): Admitting: Nurse Practitioner

## 2024-05-14 VITALS — BP 130/68 | HR 90 | Temp 98.2°F | Resp 16 | Ht 63.0 in | Wt 135.6 lb

## 2024-05-14 DIAGNOSIS — E1159 Type 2 diabetes mellitus with other circulatory complications: Secondary | ICD-10-CM | POA: Diagnosis not present

## 2024-05-14 DIAGNOSIS — E785 Hyperlipidemia, unspecified: Secondary | ICD-10-CM

## 2024-05-14 DIAGNOSIS — E559 Vitamin D deficiency, unspecified: Secondary | ICD-10-CM | POA: Diagnosis not present

## 2024-05-14 DIAGNOSIS — Z794 Long term (current) use of insulin: Secondary | ICD-10-CM | POA: Diagnosis not present

## 2024-05-14 DIAGNOSIS — I152 Hypertension secondary to endocrine disorders: Secondary | ICD-10-CM

## 2024-05-14 DIAGNOSIS — E1169 Type 2 diabetes mellitus with other specified complication: Secondary | ICD-10-CM

## 2024-05-14 MED ORDER — TIRZEPATIDE 5 MG/0.5ML ~~LOC~~ SOAJ: 5.0000 mg | SUBCUTANEOUS | 1 refills | Status: AC

## 2024-05-14 NOTE — Progress Notes (Signed)
 [facility] [address] [place], [place] [address]  Internal MEDICINE  Office Visit Note  Patient Name: Lisa Fletcher  [id number]  [id number]  Date of Service: 05/14/2024  Chief Complaint  Patient presents with   Diabetes   Hypertension   Hyperlipidemia   Follow-up    Review labs.     HPI [name] presents for a follow-up visit for lab results  Vitamin D  deficiency -- level is 27.6  Diabetes -- side effects of ozempic , has not been taking consistently every week due to the side effects. Reports feeling tired and fatigued and having increased hair loss.   Hypertension -- controlled with lisinopril   High cholesterol -- taking rosuvastatin  daily    Current Medication: Outpatient Encounter Medications as of 05/14/2024  Medication Sig   tirzepatide  (MOUNJARO ) 5 MG/0.5ML Pen Inject 5 mg into the skin once a week.   Ascorbic Acid (VITAMIN C PO) Take 1 tablet by mouth daily.   Cholecalciferol (VITAMIN D ) 2000 units tablet Take 2,000 Units by mouth daily.   Continuous Glucose Sensor (FREESTYLE LIBRE 2 PLUS SENSOR) MISC Change sensor every 15 days for CGM for diabete E11.65   docusate sodium  (COLACE) 100 MG capsule Take 1 capsule (100 mg total) by mouth daily as needed.   empagliflozin  (JARDIANCE ) 25 MG TABS tablet Take one tab a day for diabetes   GINKGO BILOBA PO Take 1 capsule by mouth daily.   Insulin  NPH, Human,, Isophane, (HUMULIN  N) 100 UNIT/ML Kiwkpen Inject 40 Units into the skin daily.   Insulin  Syringe-Needle U-100 (INSULIN  SYRINGE .5CC/31GX5/16) 31G X 5/16 0.5 ML MISC Use as directed 5 times daily.   lisinopril  (ZESTRIL ) 10 MG tablet Take 1 tablet (10 mg total) by mouth daily.   Multiple Vitamin (MULTIVITAMIN WITH MINERALS) TABS tablet Take 1 tablet by mouth daily.   oxyCODONE -acetaminophen  (PERCOCET) 5-325 MG tablet Take 1-2 tablets by mouth 2 (two) times daily as needed. To be taken after surgery   rosuvastatin  (CRESTOR ) 20 MG tablet Take 1 tablet (20 mg  total) by mouth daily.   traMADol  (ULTRAM ) 50 MG tablet Take 1 tablet (50 mg total) by mouth 2 (two) times daily as needed.   traZODone  (DESYREL ) 150 MG tablet Take 1-2 tablets (150-300 mg total) by mouth at bedtime.   [DISCONTINUED] Semaglutide , 1 MG/DOSE, (OZEMPIC , 1 MG/DOSE,) 4 MG/3ML SOPN Inject 1 mg into the skin once a week.   No facility-administered encounter medications on file as of 05/14/2024.    Surgical History: Past Surgical History:  Procedure Laterality Date   ABDOMINAL HYSTERECTOMY     1970s   CARPAL TUNNEL RELEASE Left 04/20/2017   Procedure: CARPAL TUNNEL RELEASE ENDOSCOPIC;  Surgeon: Edie Norleen PARAS, MD;  Location: [facility];  Service: Orthopedics;  Laterality: Left;  Diabetic - insulin    CARPAL TUNNEL RELEASE Right 06/08/2017   Procedure: CARPAL TUNNEL RELEASE ENDOSCOPIC;  Surgeon: Edie Norleen PARAS, MD;  Location: [facility];  Service: Orthopedics;  Laterality: Right;   CATARACT EXTRACTION W/PHACO Right 07/10/2019   Procedure: CATARACT EXTRACTION PHACO AND INTRAOCULAR LENS PLACEMENT (IOC) RIGHT DIABETIC  01:04.9  15.3%  9.97;  Surgeon: Jaye Fallow, MD;  Location: [facility];  Service: Ophthalmology;  Laterality: Right;  diabetic - insulin    CATARACT EXTRACTION W/PHACO Left 12/02/2020   Procedure: CATARACT EXTRACTION PHACO AND INTRAOCULAR LENS PLACEMENT (IOC) LEFT DIABETIC 4.85 00:39.9;  Surgeon: Jaye Fallow, MD;  Location: [facility];  Service: Ophthalmology;  Laterality: Left;  Diabetic - insulin   COLONOSCOPY  2024   TOTAL KNEE ARTHROPLASTY Right 11/14/2023   Procedure: RIGHT TOTAL KNEE ARTHROPLASTY;  Surgeon: Jerri Kay HERO, MD;  Location: [facility] OR;  Service: Orthopedics;  Laterality: Right;    Medical History: Past Medical History:  Diagnosis Date   Arthritis    Diabetes mellitus, type 2 (HCC)    Hyperlipidemia    Hypertension    Motion sickness    car(back seat) and circular motion   Wears dentures    partial upper     Family History: Family History  Problem Relation Age of Onset   Cancer Sister    Diabetes Sister    Hypertension Sister    Breast cancer Sister    Deep vein thrombosis Brother    Diabetes Brother    Hypertension Brother     Social History   Socioeconomic History   Marital status: Divorced    Spouse name: Not on file   Number of children: Not on file   Years of education: Not on file   Highest education level: Not on file  Occupational History   Not on file  Tobacco Use   Smoking status: Never   Smokeless tobacco: Never  Vaping Use   Vaping status: Never Used  Substance and Sexual Activity   Alcohol use: No   Drug use: Never   Sexual activity: Not Currently  Other Topics Concern   Not on file  Social History Narrative   Not on file   Social Drivers of Health   Financial Resource Strain: Low Risk  (07/14/2023)   Received from [facility]   Overall Financial Resource Strain (CARDIA)    Difficulty of Paying Living Expenses: Not hard at all  Food Insecurity: No Food Insecurity (07/14/2023)   Received from [facility]   Hunger Vital Sign    Within the past 12 months, you worried that your food would run out before you got the money to buy more.: Never true    Within the past 12 months, the food you bought just didn't last and you didn't have money to get more.: Never true  Transportation Needs: No Transportation Needs (07/14/2023)   Received from [facility]   PRAPARE - Transportation    Lack of Transportation (Non-Medical): No    In the past 12 months, has lack of transportation kept you from medical appointments or from getting medications?: No  Physical Activity: Not on file  Stress: Not on file  Social Connections: Not on file  Intimate Partner Violence: Not on file      Review of Systems  Constitutional:  Positive for appetite change, fatigue and unexpected weight change. Negative for chills.   HENT:  Negative for congestion, postnasal drip, rhinorrhea, sneezing and sore throat.        Hair loss  Eyes:  Negative for redness.  Respiratory: Negative.  Negative for cough, chest tightness, shortness of breath and wheezing.   Cardiovascular: Negative.  Negative for chest pain and palpitations.  Gastrointestinal:  Negative for abdominal pain, constipation, diarrhea, nausea and vomiting.  Genitourinary:  Negative for dysuria and frequency.  Musculoskeletal:  Positive for arthralgias (bilateral knees) and myalgias (legs). Negative for back pain, joint swelling and neck pain.  Skin:  Negative for rash.  Neurological: Negative.  Negative for tremors and numbness.  Hematological:  Negative for adenopathy. Does not bruise/bleed easily.  Psychiatric/Behavioral:  Positive for sleep disturbance. Negative for behavioral problems (Depression), self-injury and suicidal ideas. The patient is  not nervous/anxious.     Vital Signs: BP 130/68   Pulse 90   Temp 98.2 F (36.8 C)   Resp 16   Ht 5' 3 (1.6 m)   Wt 135 lb 9.6 oz (61.5 kg)   SpO2 96%   BMI 24.02 kg/m    Physical Exam Vitals reviewed.  Constitutional:      General: She is not in acute distress.    Appearance: Normal appearance. She is normal weight. She is not ill-appearing.  HENT:     Head: Normocephalic and atraumatic.  Eyes:     Pupils: Pupils are equal, round, and reactive to light.  Cardiovascular:     Rate and Rhythm: Normal rate and regular rhythm.  Pulmonary:     Effort: Pulmonary effort is normal. No respiratory distress.  Neurological:     Mental Status: She is alert and oriented to person, place, and time.  Psychiatric:        Mood and Affect: Mood normal.        Behavior: Behavior normal.        Assessment/Plan: 1. Type 2 diabetes mellitus with other specified complication, with long-term current use of insulin  (HCC) (Primary) Discontinue ozempic , will try mounjaro  instead.  - tirzepatide  (MOUNJARO ) 5  MG/0.5ML Pen; Inject 5 mg into the skin once a week.  Dispense: 6 mL; Refill: 1  2. Hypertension associated with diabetes (HCC) Stable, continue medication as prescribed.   3. Hyperlipidemia associated with type 2 diabetes mellitus (HCC) Continue rosuvastatin  as prescribed.   4. Vitamin D  deficiency Instructed patient to take OTC vitamin D  supplement.    General Counseling: carma dwiggins understanding of the findings of todays visit and agrees with plan of treatment. I have discussed any further diagnostic evaluation that may be needed or ordered today. We also reviewed her medications today. she has been encouraged to call the office with any questions or concerns that should arise related to todays visit.    No orders of the defined types were placed in this encounter.   Meds ordered this encounter  Medications   tirzepatide  (MOUNJARO ) 5 MG/0.5ML Pen    Sig: Inject 5 mg into the skin once a week.    Dispense:  6 mL    Refill:  1    Discontinue ozempic  and fill new script today.    Return in about 4 weeks (around 06/11/2024) for F/U, eval new med, Nickcole Bralley PCP.   Total time spent:30 Minutes Time spent includes review of chart, medications, test results, and follow up plan with the patient.   [place] Controlled Substance Database was reviewed by me.  This patient was seen by Mardy Maxin, FNP-C in collaboration with Dr. Sigrid Bathe as a part of collaborative care agreement.   Season Astacio R. Maxin, MSN, FNP-C Internal medicine

## 2024-05-25 ENCOUNTER — Encounter: Payer: Self-pay | Admitting: Nurse Practitioner

## 2024-06-14 ENCOUNTER — Ambulatory Visit: Admitting: Nurse Practitioner

## 2024-06-15 ENCOUNTER — Ambulatory Visit

## 2024-06-15 ENCOUNTER — Telehealth: Payer: Self-pay | Admitting: Nurse Practitioner

## 2024-06-15 DIAGNOSIS — Z860101 Personal history of adenomatous and serrated colon polyps: Secondary | ICD-10-CM | POA: Diagnosis not present

## 2024-06-15 DIAGNOSIS — Z09 Encounter for follow-up examination after completed treatment for conditions other than malignant neoplasm: Secondary | ICD-10-CM | POA: Diagnosis not present

## 2024-06-15 DIAGNOSIS — K642 Third degree hemorrhoids: Secondary | ICD-10-CM | POA: Diagnosis not present

## 2024-06-15 NOTE — Telephone Encounter (Signed)
 MB full, sent message to reschedule 06/14/2024 missed appointment-Toni

## 2024-06-26 ENCOUNTER — Ambulatory Visit (INDEPENDENT_AMBULATORY_CARE_PROVIDER_SITE_OTHER): Admitting: Nurse Practitioner

## 2024-06-26 ENCOUNTER — Encounter: Payer: Self-pay | Admitting: Nurse Practitioner

## 2024-06-26 VITALS — BP 130/72 | HR 80 | Temp 97.0°F | Resp 16 | Ht 63.0 in | Wt 132.8 lb

## 2024-06-26 DIAGNOSIS — Z23 Encounter for immunization: Secondary | ICD-10-CM | POA: Diagnosis not present

## 2024-06-26 DIAGNOSIS — G4709 Other insomnia: Secondary | ICD-10-CM

## 2024-06-26 DIAGNOSIS — E1169 Type 2 diabetes mellitus with other specified complication: Secondary | ICD-10-CM | POA: Diagnosis not present

## 2024-06-26 DIAGNOSIS — Z794 Long term (current) use of insulin: Secondary | ICD-10-CM

## 2024-06-26 DIAGNOSIS — E785 Hyperlipidemia, unspecified: Secondary | ICD-10-CM

## 2024-06-26 DIAGNOSIS — I152 Hypertension secondary to endocrine disorders: Secondary | ICD-10-CM

## 2024-06-26 MED ORDER — EMPAGLIFLOZIN 25 MG PO TABS: ORAL_TABLET | ORAL | 3 refills | Status: AC

## 2024-06-26 MED ORDER — TRAZODONE HCL 150 MG PO TABS: 150.0000 mg | ORAL_TABLET | Freq: Every day | ORAL | 3 refills | Status: AC

## 2024-06-26 MED ORDER — ROSUVASTATIN CALCIUM 20 MG PO TABS: 20.0000 mg | ORAL_TABLET | Freq: Every day | ORAL | 3 refills | Status: AC

## 2024-06-26 MED ORDER — ZOSTER VAC RECOMB ADJUVANTED 50 MCG/0.5ML IM SUSR: 0.5000 mL | Freq: Once | INTRAMUSCULAR | 1 refills | Status: AC | PRN

## 2024-06-26 MED ORDER — FREESTYLE LIBRE 3 SENSOR MISC: 3 refills | Status: AC

## 2024-06-26 MED ORDER — PNEUMOCOCCAL 20-VAL CONJ VACC 0.5 ML IM SUSY: 0.5000 mL | PREFILLED_SYRINGE | INTRAMUSCULAR | 0 refills | Status: AC

## 2024-06-26 MED ORDER — LISINOPRIL 10 MG PO TABS: 10.0000 mg | ORAL_TABLET | Freq: Every day | ORAL | 3 refills | Status: AC

## 2024-06-26 NOTE — Progress Notes (Signed)
 [facility] [address] [place], [place] [address]  Internal MEDICINE  Office Visit Note  Patient Name: Lisa Fletcher  [id number]  [id number]  Date of Service: 06/26/2024  Chief Complaint  Patient presents with   Diabetes   Hypertension   Hyperlipidemia   Follow-up    HPI Rai presents for a follow-up visit for diabetes, hypertension, high cholesterol and vaccines  Diabetes -- switched from ozempic  to mounjaro   Hypertension -- controlled with lisinopril  High cholesterol -- taking rosuvastatin  daily.  Due for prevnar 20 and shingrix vaccines     Current Medication: Outpatient Encounter Medications as of 06/26/2024  Medication Sig   Continuous Glucose Sensor (FREESTYLE LIBRE 3 SENSOR) MISC Place 1 sensor on the skin every 14 days. Use to check glucose continuously for diabetes   pneumococcal 20-valent conjugate vaccine (PREVNAR 20) 0.5 ML injection Inject 0.5 mLs into the muscle tomorrow at 10 am for 1 dose.   Zoster Vaccine Adjuvanted [facility]) injection Inject 0.5 mLs into the muscle once as needed for up to 1 dose.   Ascorbic Acid (VITAMIN C PO) Take 1 tablet by mouth daily.   Cholecalciferol (VITAMIN D ) 2000 units tablet Take 2,000 Units by mouth daily.   docusate sodium  (COLACE) 100 MG capsule Take 1 capsule (100 mg total) by mouth daily as needed.   empagliflozin  (JARDIANCE ) 25 MG TABS tablet Take one tab a day for diabetes   GINKGO BILOBA PO Take 1 capsule by mouth daily.   Insulin  NPH, Human,, Isophane, (HUMULIN  N) 100 UNIT/ML Kiwkpen Inject 40 Units into the skin daily.   Insulin  Syringe-Needle U-100 (INSULIN  SYRINGE .5CC/31GX5/16) 31G X 5/16 0.5 ML MISC Use as directed 5 times daily.   lisinopril  (ZESTRIL ) 10 MG tablet Take 1 tablet (10 mg total) by mouth daily.   Multiple Vitamin (MULTIVITAMIN WITH MINERALS) TABS tablet Take 1 tablet by mouth daily.   oxyCODONE -acetaminophen  (PERCOCET) 5-325 MG tablet Take 1-2 tablets by mouth 2 (two) times daily  as needed. To be taken after surgery   rosuvastatin  (CRESTOR ) 20 MG tablet Take 1 tablet (20 mg total) by mouth daily.   tirzepatide  (MOUNJARO ) 5 MG/0.5ML Pen Inject 5 mg into the skin once a week.   traMADol  (ULTRAM ) 50 MG tablet Take 1 tablet (50 mg total) by mouth 2 (two) times daily as needed.   traZODone  (DESYREL ) 150 MG tablet Take 1-2 tablets (150-300 mg total) by mouth at bedtime.   [DISCONTINUED] Continuous Glucose Sensor (FREESTYLE LIBRE 2 PLUS SENSOR) MISC Change sensor every 15 days for CGM for diabete E11.65   [DISCONTINUED] empagliflozin  (JARDIANCE ) 25 MG TABS tablet Take one tab a day for diabetes   [DISCONTINUED] lisinopril  (ZESTRIL ) 10 MG tablet Take 1 tablet (10 mg total) by mouth daily.   [DISCONTINUED] rosuvastatin  (CRESTOR ) 20 MG tablet Take 1 tablet (20 mg total) by mouth daily.   [DISCONTINUED] traZODone  (DESYREL ) 150 MG tablet Take 1-2 tablets (150-300 mg total) by mouth at bedtime.   No facility-administered encounter medications on file as of 06/26/2024.    Surgical History: Past Surgical History:  Procedure Laterality Date   ABDOMINAL HYSTERECTOMY     1970s   CARPAL TUNNEL RELEASE Left 04/20/2017   Procedure: CARPAL TUNNEL RELEASE ENDOSCOPIC;  Surgeon: Edie Norleen PARAS, MD;  Location: [facility];  Service: Orthopedics;  Laterality: Left;  Diabetic - insulin    CARPAL TUNNEL RELEASE Right 06/08/2017   Procedure: CARPAL TUNNEL RELEASE ENDOSCOPIC;  Surgeon: Edie Norleen PARAS, MD;  Location: [facility];  Service: Orthopedics;  Laterality: Right;   CATARACT EXTRACTION W/PHACO Right 07/10/2019   Procedure: CATARACT EXTRACTION PHACO AND INTRAOCULAR LENS PLACEMENT (IOC) RIGHT DIABETIC  01:04.9  15.3%  9.97;  Surgeon: Jaye Fallow, MD;  Location: [facility];  Service: Ophthalmology;  Laterality: Right;  diabetic - insulin    CATARACT EXTRACTION W/PHACO Left 12/02/2020   Procedure: CATARACT EXTRACTION PHACO AND INTRAOCULAR LENS PLACEMENT (IOC) LEFT  DIABETIC 4.85 00:39.9;  Surgeon: Jaye Fallow, MD;  Location: [facility];  Service: Ophthalmology;  Laterality: Left;  Diabetic - insulin    COLONOSCOPY  2024   TOTAL KNEE ARTHROPLASTY Right 11/14/2023   Procedure: RIGHT TOTAL KNEE ARTHROPLASTY;  Surgeon: Jerri Kay HERO, MD;  Location: [facility];  Service: Orthopedics;  Laterality: Right;    Medical History: Past Medical History:  Diagnosis Date   Arthritis    Diabetes mellitus, type 2 (HCC)    Hyperlipidemia    Hypertension    Motion sickness    car(back seat) and circular motion   Wears dentures    partial upper    Family History: Family History  Problem Relation Age of Onset   Cancer Sister    Diabetes Sister    Hypertension Sister    Breast cancer Sister    Deep vein thrombosis Brother    Diabetes Brother    Hypertension Brother     Social History   Socioeconomic History   Marital status: Divorced    Spouse name: Not on file   Number of children: Not on file   Years of education: Not on file   Highest education level: Not on file  Occupational History   Not on file  Tobacco Use   Smoking status: Never   Smokeless tobacco: Never  Vaping Use   Vaping status: Never Used  Substance and Sexual Activity   Alcohol use: No   Drug use: Never   Sexual activity: Not Currently  Other Topics Concern   Not on file  Social History Narrative   Not on file   Social Drivers of Health   Financial Resource Strain: Patient Declined (05/22/2024)   Received from [facility]   Overall Financial Resource Strain (CARDIA)    Difficulty of Paying Living Expenses: Patient declined  Food Insecurity: Patient Declined (05/22/2024)   Received from [facility]   Hunger Vital Sign    Within the past 12 months, you worried that your food would run out before you got the money to buy more.: Patient declined    Within the past 12 months, the food you bought just didn't last and you didn't have  money to get more.: Patient declined  Transportation Needs: Patient Declined (05/22/2024)   Received from [facility] - Transportation    In the past 12 months, has lack of transportation kept you from medical appointments or from getting medications?: Patient declined    Lack of Transportation (Non-Medical): Patient declined  Physical Activity: Not on file  Stress: Not on file  Social Connections: Not on file  Intimate Partner Violence: Not on file      Review of Systems  Constitutional:  Positive for appetite change, fatigue and unexpected weight change. Negative for chills.  HENT:  Negative for congestion, postnasal drip, rhinorrhea, sneezing and sore throat.        Hair loss  Eyes:  Negative for redness.  Respiratory: Negative.  Negative for cough, chest tightness, shortness of breath and wheezing.   Cardiovascular:  Negative.  Negative for chest pain and palpitations.  Gastrointestinal:  Negative for abdominal pain, constipation, diarrhea, nausea and vomiting.  Genitourinary:  Negative for dysuria and frequency.  Musculoskeletal:  Positive for arthralgias (bilateral knees) and myalgias (legs). Negative for back pain, joint swelling and neck pain.  Skin:  Negative for rash.  Neurological: Negative.  Negative for tremors and numbness.  Hematological:  Negative for adenopathy. Does not bruise/bleed easily.  Psychiatric/Behavioral:  Positive for sleep disturbance. Negative for behavioral problems (Depression), self-injury and suicidal ideas. The patient is not nervous/anxious.     Vital Signs: BP 130/72   Pulse 80   Temp (!) 97 F (36.1 C)   Resp 16   Ht 5' 3 (1.6 m)   Wt 132 lb 12.8 oz (60.2 kg)   SpO2 96%   BMI 23.52 kg/m    Physical Exam Vitals reviewed.  Constitutional:      General: She is not in acute distress.    Appearance: Normal appearance. She is normal weight. She is not ill-appearing.  HENT:     Head: Normocephalic and  atraumatic.  Eyes:     Pupils: Pupils are equal, round, and reactive to light.  Cardiovascular:     Rate and Rhythm: Normal rate and regular rhythm.  Pulmonary:     Effort: Pulmonary effort is normal. No respiratory distress.  Neurological:     Mental Status: She is alert and oriented to person, place, and time.  Psychiatric:        Mood and Affect: Mood normal.        Behavior: Behavior normal.        Assessment/Plan: 1. Type 2 diabetes mellitus with other specified complication, with long-term current use of insulin  (HCC) (Primary) Continue mounjaro  and jardiance  as prescribed. Refills ordered. Freestyle sensors sent to pharmacy as well.  - empagliflozin  (JARDIANCE ) 25 MG TABS tablet; Take one tab a day for diabetes  Dispense: 90 tablet; Refill: 3 - Continuous Glucose Sensor (FREESTYLE LIBRE 3 SENSOR) MISC; Place 1 sensor on the skin every 14 days. Use to check glucose continuously for diabetes  Dispense: 6 each; Refill: 3  2. Hypertension associated with diabetes (HCC) Stable, continue lisinopril  as prescribed.  - lisinopril  (ZESTRIL ) 10 MG tablet; Take 1 tablet (10 mg total) by mouth daily.  Dispense: 90 tablet; Refill: 3  3. Hyperlipidemia associated with type 2 diabetes mellitus (HCC) Continue rosuvastatin  as prescribed.  - rosuvastatin  (CRESTOR ) 20 MG tablet; Take 1 tablet (20 mg total) by mouth daily.  Dispense: 90 tablet; Refill: 3  4. Need for vaccination - Zoster Vaccine Adjuvanted [facility]) injection; Inject 0.5 mLs into the muscle once as needed for up to 1 dose.  Dispense: 0.5 mL; Refill: 1 - pneumococcal 20-valent conjugate vaccine (PREVNAR 20) 0.5 ML injection; Inject 0.5 mLs into the muscle tomorrow at 10 am for 1 dose.  Dispense: 0.5 mL; Refill: 0  5. Other insomnia Continue trazodone  as prescribed  - traZODone  (DESYREL ) 150 MG tablet; Take 1-2 tablets (150-300 mg total) by mouth at bedtime.  Dispense: 180 tablet; Refill: 3   General Counseling: Alease  verbalizes understanding of the findings of todays visit and agrees with plan of treatment. I have discussed any further diagnostic evaluation that may be needed or ordered today. We also reviewed her medications today. she has been encouraged to call the office with any questions or concerns that should arise related to todays visit.    No orders of the defined types were placed in this  encounter.   Meds ordered this encounter  Medications   Zoster Vaccine Adjuvanted [facility]) injection    Sig: Inject 0.5 mLs into the muscle once as needed for up to 1 dose.    Dispense:  0.5 mL    Refill:  1   pneumococcal 20-valent conjugate vaccine (PREVNAR 20) 0.5 ML injection    Sig: Inject 0.5 mLs into the muscle tomorrow at 10 am for 1 dose.    Dispense:  0.5 mL    Refill:  0   empagliflozin  (JARDIANCE ) 25 MG TABS tablet    Sig: Take one tab a day for diabetes    Dispense:  90 tablet    Refill:  3    Please fill new script today, discontinue all prior prescriptions from [facility] since she is no longer seeing that provider.   traZODone  (DESYREL ) 150 MG tablet    Sig: Take 1-2 tablets (150-300 mg total) by mouth at bedtime.    Dispense:  180 tablet    Refill:  3    Note increased dose, fill new script today, discontinue 50 mg tablet.   rosuvastatin  (CRESTOR ) 20 MG tablet    Sig: Take 1 tablet (20 mg total) by mouth daily.    Dispense:  90 tablet    Refill:  3   lisinopril  (ZESTRIL ) 10 MG tablet    Sig: Take 1 tablet (10 mg total) by mouth daily.    Dispense:  90 tablet    Refill:  3   Continuous Glucose Sensor (FREESTYLE LIBRE 3 SENSOR) MISC    Sig: Place 1 sensor on the skin every 14 days. Use to check glucose continuously for diabetes    Dispense:  6 each    Refill:  3    Dx code E11.65. for future refills.    Return in about 3 months (around 09/13/2024) for F/U, Recheck A1C, Samson Ralph PCP.   Total time spent:30 Minutes Time spent includes review of chart, medications, test  results, and follow up plan with the patient.   [place] Controlled Substance Database was reviewed by me.  This patient was seen by Mardy Maxin, FNP-C in collaboration with Dr. Sigrid Bathe as a part of collaborative care agreement.   Agapita Savarino R. Maxin, MSN, FNP-C Internal medicine

## 2024-07-17 ENCOUNTER — Ambulatory Visit: Admitting: Orthopaedic Surgery

## 2024-07-17 ENCOUNTER — Other Ambulatory Visit (INDEPENDENT_AMBULATORY_CARE_PROVIDER_SITE_OTHER): Payer: Self-pay

## 2024-07-17 DIAGNOSIS — G8929 Other chronic pain: Secondary | ICD-10-CM | POA: Diagnosis not present

## 2024-07-17 DIAGNOSIS — M25562 Pain in left knee: Secondary | ICD-10-CM

## 2024-07-17 NOTE — Progress Notes (Signed)
 Office Visit Note   Patient: Lisa Fletcher           Date of Birth: 1946-04-20           MRN: [id number] Visit Date: 07/17/2024              Requested by: Liana Fish, NP [address] [place],  [place] [address] PCP: Liana Fish, NP   Assessment & Plan: Visit Diagnoses:  1. Chronic pain of left knee     Plan: History of Present Illness Lisa Fletcher is a 78 year old female with knee arthritis who presents with left knee pain.  She has experienced left knee pain for about a month, which began after a workout session. The pain is associated with crepitus, described as grinding and popping sensations. There is no swelling in the knee. She manages the pain with over-the-counter medications such as Advil or Aleve and avoids certain gym equipment. She has not returned to the gym in two weeks due to fear of worsening the pain. Her right knee is asymptomatic.  Physical Exam MUSCULOSKELETAL: Left knee with crepitus, no swelling. Right knee with less crepitus than left.  Assessment and Plan Left knee osteoarthritis Exacerbated symptoms post-gym workout. Significant crepitus suggests patellofemoral arthritis. Symptoms tolerable, prefers conservative management. No immediate knee replacement needed. - Advise over-the-counter NSAIDs like ibuprofen or naproxen as needed. - Instruct to return if symptoms persist or worsen.  Follow-Up Instructions: No follow-ups on file.   Orders:  Orders Placed This Encounter  Procedures   XR KNEE 3 VIEW LEFT   No orders of the defined types were placed in this encounter.     Procedures: No procedures performed   Clinical Data: No additional findings.   Subjective: Chief Complaint  Patient presents with   Left Knee - Pain    HPI  Review of Systems  Constitutional: Negative.   HENT: Negative.    Eyes: Negative.   Respiratory: Negative.    Cardiovascular: Negative.   Endocrine: Negative.   Musculoskeletal: Negative.    Neurological: Negative.   Hematological: Negative.   Psychiatric/Behavioral: Negative.    All other systems reviewed and are negative.    Objective: Vital Signs: There were no vitals taken for this visit.  Physical Exam Vitals and nursing note reviewed.  Constitutional:      Appearance: She is well-developed.  HENT:     Head: Atraumatic.     Nose: Nose normal.  Eyes:     Extraocular Movements: Extraocular movements intact.  Cardiovascular:     Pulses: Normal pulses.  Pulmonary:     Effort: Pulmonary effort is normal.  Abdominal:     Palpations: Abdomen is soft.  Musculoskeletal:     Cervical back: Neck supple.  Skin:    General: Skin is warm.     Capillary Refill: Capillary refill takes less than 2 seconds.  Neurological:     Mental Status: She is alert. Mental status is at baseline.  Psychiatric:        Behavior: Behavior normal.        Thought Content: Thought content normal.        Judgment: Judgment normal.     Ortho Exam  Specialty Comments:  No specialty comments available.  Imaging: XR KNEE 3 VIEW LEFT Result Date: 07/17/2024 X-rays of the left knee show advanced tricompartmental osteoarthritis.  Bone-on-bone joint space narrowing.  Kellgren-Lawrence stage IV    PMFS History: Patient Active Problem List   Diagnosis Date Noted  Hyperlipidemia associated with type 2 diabetes mellitus (HCC) 05/14/2024   Vitamin D  deficiency 05/14/2024   Status post total right knee replacement 11/14/2023   Primary osteoarthritis of left knee 07/27/2023   Primary osteoarthritis of right knee 07/27/2023   B12 deficiency 03/06/2020   Background diabetic retinopathy associated with type 2 diabetes mellitus (HCC) 06/25/2019   Varicose veins of bilateral lower extremities with pain 12/23/2017   Uncontrolled type 2 diabetes mellitus with hyperglycemia, with long-term current use of insulin  (HCC) 06/30/2016   Diabetes mellitus (HCC) 03/24/2015   Hypertension associated  with diabetes (HCC) 03/24/2015   Past Medical History:  Diagnosis Date   Arthritis    Diabetes mellitus, type 2 (HCC)    Hyperlipidemia    Hypertension    Motion sickness    car(back seat) and circular motion   Wears dentures    partial upper    Family History  Problem Relation Age of Onset   Cancer Sister    Diabetes Sister    Hypertension Sister    Breast cancer Sister    Deep vein thrombosis Brother    Diabetes Brother    Hypertension Brother     Past Surgical History:  Procedure Laterality Date   ABDOMINAL HYSTERECTOMY     1970s   CARPAL TUNNEL RELEASE Left 04/20/2017   Procedure: CARPAL TUNNEL RELEASE ENDOSCOPIC;  Surgeon: Edie Norleen PARAS, MD;  Location: [facility];  Service: Orthopedics;  Laterality: Left;  Diabetic - insulin    CARPAL TUNNEL RELEASE Right 06/08/2017   Procedure: CARPAL TUNNEL RELEASE ENDOSCOPIC;  Surgeon: Edie Norleen PARAS, MD;  Location[facility];  Service: Orthopedics;  Laterality: Right;   CATARACT EXTRACTION W/PHACO Right 07/10/2019   Procedure: CATARACT EXTRACTION PHACO AND INTRAOCULAR LENS PLACEMENT (IOC) RIGHT DIABETIC  01:04.9  15.3%  9.97;  Surgeon: Jaye Fallow, MD;  Location: [facility];  Service: Ophthalmology;  Laterality: Right;  diabetic - insulin    CATARACT EXTRACTION W/PHACO Left 12/02/2020   Procedure: CATARACT EXTRACTION PHACO AND INTRAOCULAR LENS PLACEMENT (IOC) LEFT DIABETIC 4.85 00:39.9;  Surgeon: Jaye Fallow, MD;  Location: [facility];  Service: Ophthalmology;  Laterality: Left;  Diabetic - insulin    COLONOSCOPY  2024   TOTAL KNEE ARTHROPLASTY Right 11/14/2023   Procedure: RIGHT TOTAL KNEE ARTHROPLASTY;  Surgeon: Jerri Kay HERO, MD;  Location: [facility];  Service: Orthopedics;  Laterality: Right;   Social History   Occupational History   Not on file  Tobacco Use   Smoking status: Never   Smokeless tobacco: Never  Vaping Use   Vaping status: Never Used  Substance and Sexual Activity    Alcohol use: No   Drug use: Never   Sexual activity: Not Currently

## 2024-07-30 ENCOUNTER — Encounter: Payer: Self-pay | Admitting: Radiology

## 2024-08-07 ENCOUNTER — Other Ambulatory Visit (INDEPENDENT_AMBULATORY_CARE_PROVIDER_SITE_OTHER): Payer: Self-pay

## 2024-08-07 ENCOUNTER — Other Ambulatory Visit: Payer: Self-pay

## 2024-08-07 ENCOUNTER — Ambulatory Visit (INDEPENDENT_AMBULATORY_CARE_PROVIDER_SITE_OTHER): Admitting: Orthopaedic Surgery

## 2024-08-07 DIAGNOSIS — M25511 Pain in right shoulder: Secondary | ICD-10-CM

## 2024-08-07 DIAGNOSIS — M25562 Pain in left knee: Secondary | ICD-10-CM

## 2024-08-07 DIAGNOSIS — G8929 Other chronic pain: Secondary | ICD-10-CM | POA: Diagnosis not present

## 2024-08-07 MED ORDER — NAPROXEN 500 MG PO TABS: 500.0000 mg | ORAL_TABLET | Freq: Two times a day (BID) | ORAL | 3 refills | Status: AC

## 2024-08-07 NOTE — Progress Notes (Signed)
 Office Visit Note   Patient: Lisa Fletcher           Date of Birth: [birth year]           MRN: [id number] Visit Date: 08/07/2024              Requested by: Liana Fish, NP [address] [place],  [place] [address] PCP: Liana Fish, NP   Assessment & Plan: Visit Diagnoses:  1. Chronic pain of left knee   2. Acute pain of right shoulder     Plan: History of Present Illness Lisa Fletcher is a 78 year old female who presents after a fall with right shoulder and left knee pain.  She fell while descending stairs, impacting her right shoulder on the railing and tucking her left leg underneath. Her right shoulder is weak and painful, with difficulty grasping objects. Pain persists with movement, though less severe than initially. She has received cortisone shots previously but prefers to avoid them. She is currently using tramadol  for pain management. Her left knee is painful and cannot be fully extended without pain. The knee was 'back up' after the fall.  Physical Exam MUSCULOSKELETAL: Right shoulder pain on movement. Limited extension of left knee due to pain.  No motor or sensory deficits.    Results RADIOLOGY Right shoulder and left knee X-ray: No fracture or dislocation; severe arthritis in both joints; rotator cuff arthropathy in the right shoulder.  Assessment and Plan Left knee osteoarthritis with pain Severe osteoarthritis in the left knee, aggravated by recent fall. No fractures or dislocations on x-ray. Pain and limited range of motion present. - Prescribed higher strength naproxen for pain and inflammation. - Advised to avoid overuse of the knee.  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 No fractures or dislocations on x-ray. Pain and weakness present. Prefers to avoid cortisone injections due to previous negative experiences. - Prescribed Medrol  Dosepak for  inflammation. - Prescribed higher strength naproxen for pain and inflammation. - Advised to rest and allow symptoms to settle.  Follow-Up Instructions: No follow-ups on file.   Orders:  Orders Placed This Encounter  Procedures   XR KNEE 3 VIEW LEFT   XR Shoulder Right   Meds ordered this encounter  Medications   naproxen (NAPROSYN) 500 MG tablet    Sig: Take 1 tablet (500 mg total) by mouth 2 (two) times daily with a meal.    Dispense:  30 tablet    Refill:  3      Procedures: No procedures performed   Clinical Data: No additional findings.   Subjective: Chief Complaint  Patient presents with   Left Knee - Pain   Right Shoulder - Pain    HPI  Review of Systems  Constitutional: Negative.   HENT: Negative.    Eyes: Negative.   Respiratory: Negative.    Cardiovascular: Negative.   Endocrine: Negative.   Musculoskeletal: Negative.   Neurological: Negative.   Hematological: Negative.   Psychiatric/Behavioral: Negative.    All other systems reviewed and are negative.    Objective: Vital Signs: There were no vitals taken for this visit.  Physical Exam Vitals and nursing note reviewed.  Constitutional:      Appearance: She is well-developed.  HENT:     Head: Atraumatic.     Nose: Nose normal.  Eyes:     Extraocular Movements: Extraocular movements intact.  Cardiovascular:     Pulses:  Normal pulses.  Pulmonary:     Effort: Pulmonary effort is normal.  Abdominal:     Palpations: Abdomen is soft.  Musculoskeletal:     Cervical back: Neck supple.  Skin:    General: Skin is warm.     Capillary Refill: Capillary refill takes less than 2 seconds.  Neurological:     Mental Status: She is alert. Mental status is at baseline.  Psychiatric:        Behavior: Behavior normal.        Thought Content: Thought content normal.        Judgment: Judgment normal.     Ortho Exam  Specialty Comments:  No specialty comments available.  Imaging: XR KNEE 3  VIEW LEFT Result Date: 08/07/2024 X-rays of the left knee show advanced tricompartmental osteoarthritis.  Bone-on-bone joint space narrowing.  Kellgren-Lawrence stage IV.  No acute findings.  XR Shoulder Right Result Date: 08/07/2024 X-rays of the right shoulder show advanced osteoarthritis of the glenohumeral joint consistent with rotator cuff arthropathy.  No acute findings.    PMFS History: Patient Active Problem List   Diagnosis Date Noted   Hyperlipidemia associated with type 2 diabetes mellitus (HCC) 05/14/2024   Vitamin D  deficiency 05/14/2024   Status post total right knee replacement 11/14/2023   Primary osteoarthritis of left knee 07/27/2023   Primary osteoarthritis of right knee 07/27/2023   B12 deficiency 03/06/2020   Background diabetic retinopathy associated with type 2 diabetes mellitus (HCC) 06/25/2019   Varicose veins of bilateral lower extremities with pain 12/23/2017   Uncontrolled type 2 diabetes mellitus with hyperglycemia, with long-term current use of insulin  (HCC) 06/30/2016   Diabetes mellitus (HCC) 03/24/2015   Hypertension associated with diabetes (HCC) 03/24/2015   Past Medical History:  Diagnosis Date   Arthritis    Diabetes mellitus, type 2 (HCC)    Hyperlipidemia    Hypertension    Motion sickness    car(back seat) and circular motion   Wears dentures    partial upper    Family History  Problem Relation Age of Onset   Cancer Sister    Diabetes Sister    Hypertension Sister    Breast cancer Sister    Deep vein thrombosis Brother    Diabetes Brother    Hypertension Brother     Past Surgical History:  Procedure Laterality Date   ABDOMINAL HYSTERECTOMY     1970s   CARPAL TUNNEL RELEASE Left 04/20/2017   Procedure: CARPAL TUNNEL RELEASE ENDOSCOPIC;  Surgeon: Edie Norleen PARAS, MD;  Location: [facility];  Service: Orthopedics;  Laterality: Left;  Diabetic - insulin    CARPAL TUNNEL RELEASE Right 06/08/2017   Procedure: CARPAL TUNNEL  RELEASE ENDOSCOPIC;  Surgeon: Edie Norleen PARAS, MD;  Location: [facility];  Service: Orthopedics;  Laterality: Right;   CATARACT EXTRACTION W/PHACO Right 07/10/2019   Procedure: CATARACT EXTRACTION PHACO AND INTRAOCULAR LENS PLACEMENT (IOC) RIGHT DIABETIC  01:04.9  15.3%  9.97;  Surgeon: Jaye Fallow, MD;  Location: [facility];  Service: Ophthalmology;  Laterality: Right;  diabetic - insulin    CATARACT EXTRACTION W/PHACO Left 12/02/2020   Procedure: CATARACT EXTRACTION PHACO AND INTRAOCULAR LENS PLACEMENT (IOC) LEFT DIABETIC 4.85 00:39.9;  Surgeon: Jaye Fallow, MD;  Location: [facility];  Service: Ophthalmology;  Laterality: Left;  Diabetic - insulin    COLONOSCOPY  2024   TOTAL KNEE ARTHROPLASTY Right 11/14/2023   Procedure: RIGHT TOTAL KNEE ARTHROPLASTY;  Surgeon: Jerri Kay HERO, MD;  Location: [facility];  Service: Orthopedics;  Laterality: Right;   Social History   Occupational History   Not on file  Tobacco Use   Smoking status: Never   Smokeless tobacco: Never  Vaping Use   Vaping status: Never Used  Substance and Sexual Activity   Alcohol use: No   Drug use: Never   Sexual activity: Not Currently

## 2024-08-30 ENCOUNTER — Other Ambulatory Visit: Payer: Self-pay | Admitting: Nurse Practitioner

## 2024-08-30 DIAGNOSIS — Z1231 Encounter for screening mammogram for malignant neoplasm of breast: Secondary | ICD-10-CM

## 2024-09-13 ENCOUNTER — Ambulatory Visit: Admitting: Nurse Practitioner

## 2024-09-17 ENCOUNTER — Encounter: Payer: Self-pay | Admitting: Nurse Practitioner

## 2024-09-17 ENCOUNTER — Ambulatory Visit: Admitting: Nurse Practitioner

## 2024-09-17 VITALS — BP 138/86 | HR 87 | Temp 96.2°F | Resp 16 | Ht 63.0 in | Wt 126.0 lb

## 2024-09-17 DIAGNOSIS — R634 Abnormal weight loss: Secondary | ICD-10-CM

## 2024-09-17 DIAGNOSIS — E1169 Type 2 diabetes mellitus with other specified complication: Secondary | ICD-10-CM

## 2024-09-17 DIAGNOSIS — E1159 Type 2 diabetes mellitus with other circulatory complications: Secondary | ICD-10-CM | POA: Diagnosis not present

## 2024-09-17 DIAGNOSIS — I152 Hypertension secondary to endocrine disorders: Secondary | ICD-10-CM

## 2024-09-17 DIAGNOSIS — Z23 Encounter for immunization: Secondary | ICD-10-CM

## 2024-09-17 DIAGNOSIS — E785 Hyperlipidemia, unspecified: Secondary | ICD-10-CM | POA: Diagnosis not present

## 2024-09-17 DIAGNOSIS — Z794 Long term (current) use of insulin: Secondary | ICD-10-CM

## 2024-09-17 MED ORDER — RSVPREF3 VAC RECOMB ADJUVANTED 120 MCG/0.5ML IM SUSR: 0.5000 mL | Freq: Once | INTRAMUSCULAR | 0 refills | Status: AC | PRN

## 2024-09-17 MED ORDER — PNEUMOCOCCAL 20-VAL CONJ VACC 0.5 ML IM SUSY: 0.5000 mL | PREFILLED_SYRINGE | Freq: Once | INTRAMUSCULAR | 0 refills | Status: AC | PRN

## 2024-09-17 NOTE — Progress Notes (Signed)
 [facility] [address] [place], [place] [address]  Internal MEDICINE  Office Visit Note  Patient Name: Lisa Fletcher  [id number]  [id number]  Date of Service: 09/17/2024  Chief Complaint  Patient presents with   Acute Visit    Unintentional weight loss      HPI [name] presents for an acute sick visit for unintentional weight loss Patient has lost 6 lbs since last office visit.  She is on mounjaro  which was previously decreased from 7.5 mg to 5 mg due to weight loss. She was doing well at this dose. Now is losing weight again.  She is also on jardiance  for her diabetes.   She had her tetanus booster in June last year. She is due for prevnar 20 and RSV vaccines.      Current Medication:  Outpatient Encounter Medications as of 09/17/2024  Medication Sig   pneumococcal 20-valent conjugate vaccine (PREVNAR 20) 0.5 ML injection Inject 0.5 mLs into the muscle once as needed for up to 1 dose for immunization.   RSV vaccine recomb adjuvanted (AREXVY) 120 MCG/0.5ML injection Inject 0.5 mLs into the muscle once as needed for up to 1 dose (rsv vaccination).   Ascorbic Acid (VITAMIN C PO) Take 1 tablet by mouth daily.   Cholecalciferol (VITAMIN D ) 2000 units tablet Take 2,000 Units by mouth daily.   Continuous Glucose Sensor (FREESTYLE LIBRE 3 SENSOR) MISC Place 1 sensor on the skin every 14 days. Use to check glucose continuously for diabetes   docusate sodium  (COLACE) 100 MG capsule Take 1 capsule (100 mg total) by mouth daily as needed.   empagliflozin  (JARDIANCE ) 25 MG TABS tablet Take one tab a day for diabetes   GINKGO BILOBA PO Take 1 capsule by mouth daily.   Insulin  NPH, Human,, Isophane, (HUMULIN  N) 100 UNIT/ML Kiwkpen Inject 40 Units into the skin daily.   Insulin  Syringe-Needle U-100 (INSULIN  SYRINGE .5CC/31GX5/16) 31G X 5/16 0.5 ML MISC Use as directed 5 times daily.   lisinopril  (ZESTRIL ) 10 MG tablet Take 1 tablet (10 mg total) by mouth daily.    Multiple Vitamin (MULTIVITAMIN WITH MINERALS) TABS tablet Take 1 tablet by mouth daily.   naproxen  (NAPROSYN ) 500 MG tablet Take 1 tablet (500 mg total) by mouth 2 (two) times daily with a meal.   oxyCODONE -acetaminophen  (PERCOCET) 5-325 MG tablet Take 1-2 tablets by mouth 2 (two) times daily as needed. To be taken after surgery   rosuvastatin  (CRESTOR ) 20 MG tablet Take 1 tablet (20 mg total) by mouth daily.   tirzepatide  (MOUNJARO ) 5 MG/0.5ML Pen Inject 5 mg into the skin once a week.   traMADol  (ULTRAM ) 50 MG tablet Take 1 tablet (50 mg total) by mouth 2 (two) times daily as needed.   traZODone  (DESYREL ) 150 MG tablet Take 1-2 tablets (150-300 mg total) by mouth at bedtime.   Zoster Vaccine Adjuvanted [facility]) injection Inject 0.5 mLs into the muscle once as needed for up to 1 dose.   No facility-administered encounter medications on file as of 09/17/2024.      Medical History: Past Medical History:  Diagnosis Date   Arthritis    Diabetes mellitus, type 2 (HCC)    Hyperlipidemia    Hypertension    Motion sickness    car(back seat) and circular motion   Wears dentures    partial upper     Vital Signs: BP 138/86   Pulse 87   Temp (!) 96.2 F (35.7 C)   Resp 16  Ht 5' 3 (1.6 m)   Wt 126 lb (57.2 kg)   SpO2 96%   BMI 22.32 kg/m    Review of Systems  Constitutional:  Positive for appetite change, fatigue and unexpected weight change. Negative for chills.  HENT:  Negative for congestion, postnasal drip, rhinorrhea, sneezing and sore throat.        Hair loss  Eyes:  Negative for redness.  Respiratory: Negative.  Negative for cough, chest tightness, shortness of breath and wheezing.   Cardiovascular: Negative.  Negative for chest pain and palpitations.  Gastrointestinal:  Negative for abdominal pain, constipation, diarrhea, nausea and vomiting.  Genitourinary:  Negative for dysuria and frequency.  Musculoskeletal:  Positive for arthralgias (bilateral knees) and  myalgias (legs). Negative for back pain, joint swelling and neck pain.  Skin:  Negative for rash.  Neurological: Negative.  Negative for tremors and numbness.  Hematological:  Negative for adenopathy. Does not bruise/bleed easily.  Psychiatric/Behavioral:  Positive for sleep disturbance. Negative for behavioral problems (Depression), self-injury and suicidal ideas. The patient is not nervous/anxious.     Physical Exam Vitals reviewed.  Constitutional:      General: She is not in acute distress.    Appearance: Normal appearance. She is normal weight. She is not ill-appearing.  HENT:     Head: Normocephalic and atraumatic.  Eyes:     Pupils: Pupils are equal, round, and reactive to light.  Cardiovascular:     Rate and Rhythm: Normal rate and regular rhythm.  Pulmonary:     Effort: Pulmonary effort is normal. No respiratory distress.  Neurological:     Mental Status: She is alert and oriented to person, place, and time.  Psychiatric:        Mood and Affect: Mood normal.        Behavior: Behavior normal.       Assessment/Plan: 1. Type 2 diabetes mellitus with other specified complication, with long-term current use of insulin  (HCC) (Primary) Continue jardiance  but hold mounjaro  for now. Follow up in a month or so.   2. Hypertension associated with diabetes (HCC) Stable, continue medications as prescribed.   3. Hyperlipidemia associated with type 2 diabetes mellitus (HCC) Continue rosuvastatin  as prescribed.   4. Unintentional weight loss Hold mounjaro  for now.   5. Need for vaccination - pneumococcal 20-valent conjugate vaccine (PREVNAR 20) 0.5 ML injection; Inject 0.5 mLs into the muscle once as needed for up to 1 dose for immunization.  Dispense: 0.5 mL; Refill: 0 - RSV vaccine recomb adjuvanted (AREXVY) 120 MCG/0.5ML injection; Inject 0.5 mLs into the muscle once as needed for up to 1 dose (rsv vaccination).  Dispense: 0.5 mL; Refill: 0   General Counseling: Shreya  verbalizes understanding of the findings of todays visit and agrees with plan of treatment. I have discussed any further diagnostic evaluation that may be needed or ordered today. We also reviewed her medications today. she has been encouraged to call the office with any questions or concerns that should arise related to todays visit.    Counseling:    No orders of the defined types were placed in this encounter.   Meds ordered this encounter  Medications   pneumococcal 20-valent conjugate vaccine (PREVNAR 20) 0.5 ML injection    Sig: Inject 0.5 mLs into the muscle once as needed for up to 1 dose for immunization.    Dispense:  0.5 mL    Refill:  0    Due for prevnar 20 please   RSV vaccine  recomb adjuvanted (AREXVY) 120 MCG/0.5ML injection    Sig: Inject 0.5 mLs into the muscle once as needed for up to 1 dose (rsv vaccination).    Dispense:  0.5 mL    Refill:  0    Due for rsv vaccine please    Return in about 1 month (around 10/18/2024) for F/U, Tasnia Spegal PCP diabetes management. .  [place] Controlled Substance Database was reviewed by me for overdose risk score (ORS)  Time spent:30 Minutes Time spent with patient included reviewing progress notes, labs, imaging studies, and discussing plan for follow up.   This patient was seen by Mardy Maxin, FNP-C in collaboration with Dr. Sigrid Bathe as a part of collaborative care agreement.  Lamoyne Hessel R. Maxin, MSN, FNP-C Internal Medicine

## 2024-09-17 NOTE — Patient Instructions (Signed)
 Stop mounjaro  for now. Continue jardiance  as prescribed Follow up in 1 month

## 2024-09-19 ENCOUNTER — Other Ambulatory Visit: Payer: Self-pay

## 2024-09-19 ENCOUNTER — Telehealth: Payer: Self-pay

## 2024-09-24 MED ORDER — SERTRALINE HCL 25 MG PO TABS: 25.0000 mg | ORAL_TABLET | Freq: Every day | ORAL | 3 refills | Status: AC

## 2024-09-24 NOTE — Telephone Encounter (Signed)
Try to call pt mail box is full

## 2024-09-25 ENCOUNTER — Other Ambulatory Visit: Payer: Self-pay | Admitting: Physician Assistant

## 2024-09-27 ENCOUNTER — Encounter: Payer: Self-pay | Admitting: Nurse Practitioner

## 2024-10-03 ENCOUNTER — Ambulatory Visit
Admission: RE | Admit: 2024-10-03 | Discharge: 2024-10-03 | Disposition: A | Discharge status: Home / Self Care | Source: Ambulatory Visit | Attending: Nurse Practitioner | Admitting: Nurse Practitioner

## 2024-10-03 DIAGNOSIS — Z1231 Encounter for screening mammogram for malignant neoplasm of breast: Secondary | ICD-10-CM | POA: Diagnosis present

## 2024-10-18 ENCOUNTER — Ambulatory Visit: Admitting: Nurse Practitioner

## 2024-10-31 ENCOUNTER — Telehealth: Payer: Self-pay | Admitting: Nurse Practitioner

## 2024-10-31 NOTE — Telephone Encounter (Signed)
 MB full, sent message to reschedule 10/18/2024 missed appointment-Toni
